# Patient Record
Sex: Female | Born: 1950 | Race: White | Hispanic: No | Marital: Single | Smoking: Current every day smoker
Health system: Southern US, Community
[De-identification: ages and names within clinical notes are randomized; demographics above are authoritative.]

## PROBLEM LIST (undated history)

## (undated) DIAGNOSIS — D649 Anemia, unspecified: Secondary | ICD-10-CM

## (undated) DIAGNOSIS — R609 Edema, unspecified: Secondary | ICD-10-CM

## (undated) DIAGNOSIS — K52831 Collagenous colitis: Secondary | ICD-10-CM

## (undated) DIAGNOSIS — F32A Depression, unspecified: Secondary | ICD-10-CM

## (undated) DIAGNOSIS — F329 Major depressive disorder, single episode, unspecified: Secondary | ICD-10-CM

## (undated) DIAGNOSIS — M1990 Unspecified osteoarthritis, unspecified site: Secondary | ICD-10-CM

## (undated) HISTORY — DX: Major depressive disorder, single episode, unspecified: F32.9

## (undated) HISTORY — DX: Depression, unspecified: F32.A

## (undated) HISTORY — DX: Collagenous colitis: K52.831

## (undated) HISTORY — DX: Edema, unspecified: R60.9

## (undated) HISTORY — PX: GASTRIC BYPASS: SHX52

---

## 2015-01-29 HISTORY — PX: CARDIAC CATHETERIZATION: SHX172

## 2015-08-10 ENCOUNTER — Encounter: Payer: Self-pay | Admitting: Gastroenterology

## 2015-08-16 ENCOUNTER — Ambulatory Visit: Payer: Self-pay | Admitting: Gastroenterology

## 2015-08-16 ENCOUNTER — Telehealth: Payer: Self-pay | Admitting: *Deleted

## 2015-08-16 NOTE — Telephone Encounter (Signed)
Called patient to inform her she missed her appointment today, she stated she is in the hospital with non GI related issues and will contact the office when she gets home to reschedule her appointment

## 2015-08-24 ENCOUNTER — Encounter: Payer: Self-pay | Admitting: Gastroenterology

## 2015-10-24 ENCOUNTER — Telehealth: Payer: Self-pay | Admitting: *Deleted

## 2015-10-24 NOTE — Telephone Encounter (Signed)
Called and left message for patient that Dr Lavon PaganiniNandigam approved her to come in on 9/28 at 10:45 if she wants that appointment to give us a call back and let us know.  Otherwise her appointment is scheduled on 10/11

## 2015-10-26 NOTE — Telephone Encounter (Signed)
Patient never returned call for an appointment today

## 2015-11-08 ENCOUNTER — Encounter (INDEPENDENT_AMBULATORY_CARE_PROVIDER_SITE_OTHER): Payer: Self-pay

## 2015-11-08 ENCOUNTER — Other Ambulatory Visit (INDEPENDENT_AMBULATORY_CARE_PROVIDER_SITE_OTHER): Payer: PPO

## 2015-11-08 ENCOUNTER — Encounter: Payer: Self-pay | Admitting: Gastroenterology

## 2015-11-08 ENCOUNTER — Ambulatory Visit (INDEPENDENT_AMBULATORY_CARE_PROVIDER_SITE_OTHER): Payer: PPO | Admitting: Gastroenterology

## 2015-11-08 VITALS — BP 94/60 | HR 84 | Ht 62.21 in | Wt 132.2 lb

## 2015-11-08 DIAGNOSIS — K52831 Collagenous colitis: Secondary | ICD-10-CM

## 2015-11-08 DIAGNOSIS — R634 Abnormal weight loss: Secondary | ICD-10-CM | POA: Diagnosis not present

## 2015-11-08 DIAGNOSIS — R197 Diarrhea, unspecified: Secondary | ICD-10-CM | POA: Diagnosis not present

## 2015-11-08 DIAGNOSIS — K909 Intestinal malabsorption, unspecified: Secondary | ICD-10-CM

## 2015-11-08 LAB — CBC WITH DIFFERENTIAL/PLATELET
Basophils Absolute: 0.1 10*3/uL (ref 0.0–0.1)
Basophils Relative: 0.4 % (ref 0.0–3.0)
Eosinophils Absolute: 0.2 10*3/uL (ref 0.0–0.7)
Eosinophils Relative: 1.7 % (ref 0.0–5.0)
HCT: 31.4 % — ABNORMAL LOW (ref 36.0–46.0)
Hemoglobin: 9.8 g/dL — ABNORMAL LOW (ref 12.0–15.0)
Lymphocytes Relative: 28.2 % (ref 12.0–46.0)
Lymphs Abs: 3.4 10*3/uL (ref 0.7–4.0)
MCHC: 31.1 g/dL (ref 30.0–36.0)
MCV: 70.1 fl — ABNORMAL LOW (ref 78.0–100.0)
Monocytes Absolute: 0.7 10*3/uL (ref 0.1–1.0)
Monocytes Relative: 5.6 % (ref 3.0–12.0)
Neutro Abs: 7.8 10*3/uL — ABNORMAL HIGH (ref 1.4–7.7)
Neutrophils Relative %: 64.1 % (ref 43.0–77.0)
Platelets: 497 10*3/uL — ABNORMAL HIGH (ref 150.0–400.0)
RBC: 4.47 Mil/uL (ref 3.87–5.11)
RDW: 19.3 % — ABNORMAL HIGH (ref 11.5–15.5)
WBC: 12.2 10*3/uL — ABNORMAL HIGH (ref 4.0–10.5)

## 2015-11-08 LAB — COMPREHENSIVE METABOLIC PANEL WITH GFR
ALT: 14 U/L (ref 0–35)
AST: 20 U/L (ref 0–37)
Albumin: 2.5 g/dL — ABNORMAL LOW (ref 3.5–5.2)
Alkaline Phosphatase: 29 U/L — ABNORMAL LOW (ref 39–117)
BUN: 22 mg/dL (ref 6–23)
CO2: 30 meq/L (ref 19–32)
Calcium: 8.2 mg/dL — ABNORMAL LOW (ref 8.4–10.5)
Chloride: 104 meq/L (ref 96–112)
Creatinine, Ser: 0.71 mg/dL (ref 0.40–1.20)
GFR: 87.8 mL/min
Glucose, Bld: 103 mg/dL — ABNORMAL HIGH (ref 70–99)
Potassium: 3.5 meq/L (ref 3.5–5.1)
Sodium: 139 meq/L (ref 135–145)
Total Bilirubin: 0.2 mg/dL (ref 0.2–1.2)
Total Protein: 4.7 g/dL — ABNORMAL LOW (ref 6.0–8.3)

## 2015-11-08 MED ORDER — BUDESONIDE 9 MG PO TB24: ORAL_TABLET | ORAL | 0 refills | Status: DC

## 2015-11-08 MED ORDER — CHOLESTYRAMINE 4 G PO PACK: 4.0000 g | PACK | Freq: Two times a day (BID) | ORAL | 6 refills | Status: DC

## 2015-11-08 NOTE — Progress Notes (Signed)
Renee [name]    [id number]    03/24/1950  Primary Care Physician:Pcp Not In System  Referring Physician: No referring provider defined for this encounter.  Chief complaint:  Diarrhea  HPI: 65 year old female here for second opinion with complaints of diarrhea for past 7 months. He is having 10-12 watery liquid bowel movements a day. She has lost 40 pounds in the past 6 months. Patient underwent EGD and colonoscopy on 06/08/2015 for evaluation of chronic diarrhea and iron deficiency anemia, and colon random biopsies showed findings suggestive of collagenous colitis. She is status post gastric bypass surgery. She feels chronic fatigue, and nocturnal cramps in the legs. He reports taking about 2 or 3 ibuprofen a day for pain in both wrists and arms. Denies any nausea, vomiting, abdominal pain, melena or bright red blood per rectum  Outpatient Encounter Prescriptions as of 11/08/2015  Medication Sig  . buPROPion (WELLBUTRIN XL) 150 MG 24 hr tablet Take 150 mg by mouth daily.  . hydrochlorothiazide (HYDRODIURIL) 25 MG tablet Take 25 mg by mouth daily.  Marland Kitchen. POTASSIUM PO Take 1 tablet by mouth 2 (two) times daily.  . traZODone (DESYREL) 50 MG tablet TAKE 2 TABLETS BY MOUTH EVERY NIGHT AT BEDTIME AS NEEDED FOR SLEEP  . Budesonide 9 MG TB24 Samples given in office  18 day supply  Exp 11/19     Lot  [id number]  . Calcium Carb-Ergocalciferol 500-200 MG-UNIT TABS Take 1 tablet by mouth daily.  . cholestyramine (QUESTRAN) 4 g packet Take 1 packet (4 g total) by mouth 2 (two) times daily.  . citalopram (CELEXA) 40 MG tablet Take 40 mg by mouth daily.   . Multiple Vitamin (MULTI-VITAMINS) TABS Take 1 tablet by mouth daily.   No facility-administered encounter medications on file as of 11/08/2015.     Allergies as of 11/08/2015 - never reviewed  Allergen Reaction Noted  . 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 Depression   . Edema     Past Surgical  History:  Procedure Laterality Date  . CESAREAN SECTION    . GASTRIC BYPASS      Family History  Problem Relation Age of Onset  . Diabetes Mother   . Heart disease Mother   . Heart disease Father   . Breast cancer Maternal Grandmother   . Colon cancer Paternal Grandfather     Social History   Social History  . Marital status: Married    Spouse name: N/A  . Number of children: 1  . Years of education: N/A   Occupational History  . LPN     retired  . [employer]    Social History Main Topics  . Smoking status: Current Some Day Smoker  . Smokeless tobacco: Never Used     Comment: social  . Alcohol use No  . Drug use: No  . Sexual activity: Not on file   Other Topics Concern  . Not on file   Social History Narrative  . No narrative on file      Review of systems: Review of Systems  Constitutional: Negative for fever and chills.  HENT: Negative.   Eyes: Negative for blurred vision.  Respiratory: Negative for cough, shortness of breath and wheezing.   Cardiovascular: Negative for chest pain and palpitations.  Gastrointestinal: as per HPI Genitourinary: Negative for dysuria, urgency, frequency and hematuria.  Musculoskeletal: Positive for myalgias, back pain and joint pain.  Skin: Negative for itching and rash.  Neurological: Negative for dizziness, tremors, focal weakness, seizures and loss of consciousness.  Endo/Heme/Allergies: Positive for seasonal allergies.  Psychiatric/Behavioral: Positive for depression, negative for suicidal ideas and hallucinations.  All other systems reviewed and are negative.   Physical Exam: Vitals:   11/08/15 1326  BP: 94/60  Pulse: 84   Body mass index is 24.03 kg/m. Gen:      No acute distress HEENT:  EOMI, sclera anicteric Neck:     No masses; no thyromegaly Lungs:    Clear to auscultation bilaterally; normal respiratory effort CV:         Regular rate and rhythm; no murmurs Abd:      + bowel sounds; soft,  non-tender; no palpable masses, no distension Ext:    No edema; adequate peripheral perfusion Skin:      Warm and dry; no rash Neuro: alert and oriented x 3 Psych: normal mood and affect  Data Reviewed:  Reviewed labs, radiology imaging, old records and pertinent past GI work up C. difficile and she has to pathogen panel negative Celiac negative Random colon biopsies positive for collagenous colitis  Assessment and Plan/Recommendations: 65 year old female status post gastric bypass with complaints of chronic diarrhea for past 7 months and also has iron deficiency anemia Random colon biopsies positive for finding of collagenous colitis We will start budesonide 9 mg daily, patient was given samples for 3 weeks We will send prescription to compound pharmacy for budesonide 10 mg after she completes the samples for 10 days and then she'll take 5 mg budesonide for additional 4 weeks taper She also appears malnourished, likely has low albumin level  Encouraged patient to increase protein intake and nutritional drinks Avoid lactose and high fructose Avoid nsaids We'll check CMP and CBC  Cholestyramine 4 g twice a day with meals Return in 4-6 weeks  Greater than 50% of the time used for counseling as well as treatment plan and follow-up. She had multiple questions which were answered to her satisfaction  K. Scherry Ran , MD [phone] Mon-Fri 8a-5p [phone] after 5p, weekends, holidays  CC: No ref. provider found

## 2015-11-08 NOTE — Patient Instructions (Addendum)
We have given you Uceris samples to take 9 mg daily for 18 days- until samples run out  The Budesonide 10 mg will be sent to [facility] [place] [phone], Per your request we will call this in for you once you complete your samples given in the office  You will take Budesonide 10 mg for 10 days the take 5 mg (Half the tablet) for 30 days thereafter  Then Stop  We will send Cholestyramine 4 g twice a day to your pharmacy  Avoid NSAIDS  Avoid artificial sweeteners and high fructose corn syrup   Go to the basement for labs today  Lactose free diet   Lactose-Free Diet, Adult If you have lactose intolerance, you are not able to digest lactose. Lactose is a natural sugar found mainly in milk and milk products. You may need to avoid all foods and beverages that contain lactose. A lactose-free diet can help you do this.  WHAT DO I NEED TO KNOW ABOUT THIS DIET?  Do not consume foods, beverages, vitamins, minerals, or medicines with lactose. Read ingredients lists carefully.  Look for the words "lactose-free" on labels.  Use lactase enzyme drops or tablets as directed by your health care provider.  Use lactose-free milk or a milk alternative, such as soy milk, for drinking and cooking.  Make sure you get enough calcium and vitamin D in your diet. A lactose-free eating plan can be lacking in these important nutrients.  Take calcium and vitamin D supplements as directed by your health care provider. Talk to your provider about supplements if you are not able to get enough calcium and vitamin D from food. WHICH FOODS HAVE LACTOSE? Lactose is found in:   Milk and foods made from milk.  Yogurt.   Cheese.  Butter.   Margarine.   Sour cream.   Cream.   Whipped toppings and nondairy creamers.  Ice cream and other milk-based desserts. Lactose is also found in foods or products made with milk or milk ingredients. To find out whether a food contains milk or a  milk ingredient, look at the ingredients list. Avoid foods with the statement "May contain milk" and foods that contain:   Butter.   Cream.  Milk.  Milk solids.  Milk powder.   Whey.  Curd.  Caseinate.  Lactose.  Lactalbumin.  Lactoglobulin. WHAT ARE SOME ALTERNATIVES TO MILK AND FOODS MADE WITH MILK PRODUCTS?  Lactose-free milk.  Soy milk with added calcium and vitamin D.  Almond, coconut, or rice milk with added calcium and vitamin D. Note that these are low in protein.   Soy products, such as soy yogurt, soy cheese, soy ice cream, and soy-based sour cream. WHICH FOODS CAN I EAT? Grains Breads and rolls made without milk, such as [place], [place], or [place] bread, bagels, pita, and Pitney Bowesmatzo. Corn tortillas, corn meal, grits, and polenta. Crackers without lactose or milk solids, such as soda crackers and graham crackers. Cooked or dry cereals without lactose or milk solids. Pasta, quinoa, couscous, barley, oats, bulgur, farro, rice, wild rice, or other grains prepared without milk or lactose. Plain popcorn.  Vegetables Fresh, frozen, and canned vegetables without cheese, cream, or butter sauces. Fruits All fresh, canned, frozen, or dried fruits that are not processed with lactose. Meats and Other Protein Sources Plain beef, chicken, fish, [place], lamb, veal, pork, wild game, or ham. Kosher-prepared meat products. Strained or junior meats that do not contain milk. Eggs. Soy meat substitutes. Beans, lentils, and hummus. Tofu.  Nuts and seeds. Peanut or other nut butters without lactose. Soups, casseroles, and mixed dishes without cheese, cream, or milk.  Dairy Lactose-free milk. Soy, rice, or almond milk with added calcium and vitamin D. Soy cheese and yogurt. Beverages Carbonated drinks. Tea. Coffee, freeze-dried coffee, and some instant coffees. Fruit and vegetable juices.  Condiments Soy sauce. Carob powder. Olives. Gravy made with water. Baker's cocoa. Rosita Fire.  Pure seasonings and spices. Ketchup. Mustard. Bouillon. Broth.  Sweets and Desserts Water and fruit ices. Gelatin. Cookies, pies, or cakes made from allowed ingredients, such as angel food cake. Pudding made with water or a milk substitute. Lactose-free tofu desserts. Soy, coconut milk, or rice-milk-based frozen desserts. Sugar. Honey. Jam, jelly, and marmalade. Molasses. Pure sugar candy. Dark chocolate without milk. Marshmallows.  Fats and Oils Margarines and salad dressings that do not contain milk. Tomasa Blase. Vegetable oils. Shortening. Mayonnaise. Soy or coconut-based cream.  The items listed above may not be a complete list of recommended foods or beverages. Contact your dietitian for more options.  WHICH FOODS ARE NOT RECOMMENDED? Grains Breads and rolls that contain milk. Toaster pastries. Muffins, biscuits, waffles, cornbread, and pancakes. These can be prepared at home, commercial, or from mixes. Sweet rolls, donuts, English muffins, fry bread, lefse, flour tortillas with lactose, or [place] toast made with milk or milk ingredients. Crackers that contain lactose. Corn curls. Cooked or dry cereals with lactose. Vegetables Creamed or breaded vegetables. Vegetables in a cheese or butter sauce or with lactose-containing margarines. Instant potatoes. [place] fries. Scalloped or au gratin potatoes.  Fruits None.  Meats and Other Protein Sources Scrambled eggs, omelets, and souffles that contain milk. Creamed or breaded meat, fish, chicken, or [place]. Sausage products, such as wieners and liver sausage. Cold cuts that contain milk solids. Cheese, cottage cheese, ricotta cheese, and cheese spreads. Lasagna and macaroni and cheese. Pizza. Peanut or other nut butters with added milk solids. Casseroles or mixed dishes containing milk or cheese.  Dairy All dairy products, including milk, goat's milk, buttermilk, kefir, acidophilus milk, flavored milk, evaporated milk, condensed milk, dulce de [place], eggnog,  yogurt, cheese, and cheese spreads.  Beverages Hot chocolate. Cocoa with lactose. Instant iced teas. Powdered fruit drinks. Smoothies made with milk or yogurt.  Condiments Chewing gum that has lactose. Cocoa that has lactose. Spice blends if they contain milk products. Artificial sweeteners that contain lactose. Nondairy creamers.  Sweets and Desserts Ice cream, ice milk, gelato, sherbet, and frozen yogurt. Custard, pudding, and mousse. Cake, cream pies, cookies, and other desserts containing milk, cream, cream cheese, or milk chocolate. Pie crust made with milk-containing margarine or butter. Reduced-calorie desserts made with a sugar substitute that contains lactose. Toffee and butterscotch. Milk, white, or dark chocolate that contains milk. Fudge. Caramel.  Fats and Oils Margarines and salad dressings that contain milk or cheese. Cream. Half and half. Cream cheese. Sour cream. Chip dips made with sour cream or yogurt.  The items listed above may not be a complete list of foods and beverages to avoid. Contact your dietitian for more information. AM I GETTING ENOUGH CALCIUM? Calcium is found in many foods that contain lactose and is important for bone health. The amount of calcium you need depends on your age:   Adults younger than 50 years: 1000 mg of calcium a day.  Adults older than 50 years: 1200 mg of calcium a day. If you are not getting enough calcium, other calcium sources include:   Orange juice with calcium added. There are 300-350 mg  of calcium in 1 cup of orange juice.   Sardines with edible bones. There are 325 mg of calcium in 3 oz of sardines.   Calcium-fortified soy milk. There are 300-400 mg of calcium in 1 cup of calcium-fortified soy milk.  Calcium-fortified rice or almond milk. There are 300 mg of calcium in 1 cup of calcium-fortified rice or almond milk.  Canned salmon with edible bones. There are 180 mg of calcium in 3 oz of canned salmon with edible bones.    Calcium-fortified breakfast cereals. There are [phone] mg of calcium in calcium-fortified breakfast cereals.   Tofu set with calcium sulfate. There are 250 mg of calcium in  cup of tofu set with calcium sulfate.  Spinach, cooked. There are 145 mg of calcium in  cup of cooked spinach.  Edamame, cooked. There are 130 mg of calcium in  cup of cooked edamame.  Collard greens, cooked. There are 125 mg of calcium in  cup of cooked collard greens.  Kale, frozen or cooked. There are 90 mg of calcium in  cup of cooked or frozen kale.   Almonds. There are 95 mg of calcium in  cup of almonds.  Broccoli, cooked. There are 60 mg of calcium in 1 cup of cooked broccoli.   This information is not intended to replace advice given to you by your health care provider. Make sure you discuss any questions you have with your health care provider.   Document Released: 07/06/2001 Document Revised: 05/31/2014 Document Reviewed: 04/16/2013 Elsevier Interactive Patient Education [employer]

## 2015-11-16 ENCOUNTER — Telehealth: Payer: Self-pay

## 2015-11-16 NOTE — Telephone Encounter (Signed)
Pt called stating that med for diarrhea is helping that Dr Lavon PaganiniNandigam Rx. Also wanted to inform of bilateral leg edema. She denies SOB or chest pain. Instructed that she should contact PCP for this.

## 2015-11-17 NOTE — Telephone Encounter (Signed)
Dr Nandigam FYI 

## 2015-11-20 NOTE — Telephone Encounter (Signed)
Called patient to inform We have three boxes of samples here for her of Uceris. Patient wanted me to contact [facility]  And call her Budesonide in . Called pharmacy and called in budesonide for patient    # 60 with 1 refill to cover her reducing to 5 mg daily for two months after completing 10 mg for 1 month  Patient will pick up tomorrow

## 2015-11-20 NOTE — Telephone Encounter (Signed)
Call back patient, diarrhea has improved significantly, is having 1-2 formed BM per day. Continues to have lower extremity edema, likely secondary to severe hypoalbuminemia Advised patient to eat protein rich diet and follow up with PMD to see if she needs to be started on Low dose Lasix instead of HCTZ and also to see if needs further cardiac work up to exclude CHF She has only 5 more days of Uceris 9mg  samples Will send Rx to compound pharmacy Budesonide 10mg  daily X 1 month followed by 5mg  daily x 2 months Follow up in office in 4-6 weeks

## 2016-01-04 ENCOUNTER — Ambulatory Visit: Payer: PPO | Admitting: Gastroenterology

## 2016-01-23 ENCOUNTER — Telehealth: Payer: Self-pay | Admitting: *Deleted

## 2016-01-23 NOTE — Telephone Encounter (Signed)
Left message for pt to call the office and schedule a follow up with Dr Lavon PaganiniNandigam

## 2016-03-29 ENCOUNTER — Telehealth: Payer: Self-pay | Admitting: Gastroenterology

## 2016-03-29 MED ORDER — BUDESONIDE 9 MG PO TB24: ORAL_TABLET | ORAL | 1 refills | Status: DC

## 2016-03-29 NOTE — Telephone Encounter (Signed)
Called patient and she wanted Budesonide called into her pharmacy. She had an appointment to see Jess on 3/13 but wanted to see Dr Lavon PaganiniNandigam next week if she could be fit in for Colitis. Said she thought she was having a flare. I offered to take a triage call and send to the nurse but she said she just wanted to see Dr Lavon PaganiniNandigam on Tuesday that she would be fine taking the budesonide over the weekend.   Dr Lavon PaganiniNandigam I put this patient on for Tuesday at 10:45am

## 2016-04-01 NOTE — Telephone Encounter (Signed)
Ok thanks 

## 2016-04-02 ENCOUNTER — Other Ambulatory Visit (INDEPENDENT_AMBULATORY_CARE_PROVIDER_SITE_OTHER): Payer: PPO

## 2016-04-02 ENCOUNTER — Encounter (INDEPENDENT_AMBULATORY_CARE_PROVIDER_SITE_OTHER): Payer: Self-pay

## 2016-04-02 ENCOUNTER — Encounter: Payer: Self-pay | Admitting: Gastroenterology

## 2016-04-02 ENCOUNTER — Ambulatory Visit (INDEPENDENT_AMBULATORY_CARE_PROVIDER_SITE_OTHER): Payer: PPO | Admitting: Gastroenterology

## 2016-04-02 VITALS — BP 116/70 | HR 80 | Ht 62.0 in | Wt 131.2 lb

## 2016-04-02 DIAGNOSIS — Z9884 Bariatric surgery status: Secondary | ICD-10-CM | POA: Diagnosis not present

## 2016-04-02 DIAGNOSIS — R197 Diarrhea, unspecified: Secondary | ICD-10-CM | POA: Diagnosis not present

## 2016-04-02 DIAGNOSIS — K52838 Other microscopic colitis: Secondary | ICD-10-CM

## 2016-04-02 DIAGNOSIS — K52831 Collagenous colitis: Secondary | ICD-10-CM | POA: Diagnosis not present

## 2016-04-02 LAB — COMPREHENSIVE METABOLIC PANEL
ALBUMIN: 3.5 g/dL (ref 3.5–5.2)
ALT: 16 U/L (ref 0–35)
AST: 19 U/L (ref 0–37)
Alkaline Phosphatase: 42 U/L (ref 39–117)
BUN: 21 mg/dL (ref 6–23)
CALCIUM: 9 mg/dL (ref 8.4–10.5)
CHLORIDE: 104 meq/L (ref 96–112)
CO2: 29 meq/L (ref 19–32)
CREATININE: 0.54 mg/dL (ref 0.40–1.20)
GFR: 120.26 mL/min (ref >60.00)
Glucose, Bld: 140 mg/dL — ABNORMAL HIGH (ref 70–99)
Potassium: 3.8 mEq/L (ref 3.5–5.1)
Sodium: 138 mEq/L (ref 135–145)
Total Bilirubin: 0.3 mg/dL (ref 0.2–1.2)
Total Protein: 6 g/dL (ref 6.0–8.3)

## 2016-04-02 LAB — FERRITIN: FERRITIN: 3.3 ng/mL — AB (ref 10.0–291.0)

## 2016-04-02 LAB — VITAMIN B12: VITAMIN B 12: 281 pg/mL (ref 211–911)

## 2016-04-02 LAB — FOLATE

## 2016-04-02 NOTE — Patient Instructions (Addendum)
Take Budesonide 9mg  daily for 3 months  Use VSL#3 1 capsule daily  Start pepto-bismol in 1 month 1 tablet four times daily   Your follow up appointment is on 05/23/2016 at 10am   Go to the basement for labs today

## 2016-04-02 NOTE — Progress Notes (Signed)
Renee [name]    [id number]    Nov 11, 1950  Primary Care Physician:Pcp Not In System  Referring Physician: No referring provider defined for this encounter.  Chief complaint:  Colitis, diarrhea  HPI: 66 year old female with history of gastric bypass surgery, diagnosed with collagenous colitis in May 2017. She was last seen in office in October 2017 when she switched her care to our practice from [facility] She was having chronic diarrhea with multiple bowel movements for about 7 months before she came to see me in October and had lost 40 pounds. She was started on budesonide 9 mg daily, which she took for one month and tapered down to 5 mg daily compounded budesonide for 1 additional month. Patient missed her follow-up appointment in December. She was doing well for about 4-6 weeks after she tapered off budesonide , last week she noticed that she was having increased bowel frequency and diarrhea , called office for refill of budesonide . Her bowel movements have improved to 1-2 soft movements daily since she started taking it for the past 2 days.  She has started taking NSAID's , Advil 1-2 tablets daily . Denies any other new medications   No fever, nausea, vomiting, abdominal pain, melena or blood per rectum  Outpatient Encounter Prescriptions as of 04/02/2016  Medication Sig  . budesonide (ENTOCORT EC) 3 MG 24 hr capsule Take 9 mg by mouth daily.  Marland Kitchen buPROPion (WELLBUTRIN XL) 150 MG 24 hr tablet Take 150 mg by mouth daily.  . Calcium Carb-Ergocalciferol 500-200 MG-UNIT TABS Take 1 tablet by mouth daily.  . citalopram (CELEXA) 40 MG tablet Take 40 mg by mouth daily.   . Multiple Vitamin (MULTI-VITAMINS) TABS Take 1 tablet by mouth daily.  Marland Kitchen POTASSIUM PO Take 1 tablet by mouth 2 (two) times daily.  . traZODone (DESYREL) 50 MG tablet TAKE 2 TABLETS BY MOUTH EVERY NIGHT AT BEDTIME AS NEEDED FOR SLEEP  . [DISCONTINUED] Budesonide 9 MG TB24 Once a day  . [DISCONTINUED] cholestyramine  (QUESTRAN) 4 g packet Take 1 packet (4 g total) by mouth 2 (two) times daily.  . [DISCONTINUED] hydrochlorothiazide (HYDRODIURIL) 25 MG tablet Take 25 mg by mouth daily.   No facility-administered encounter medications on file as of 04/02/2016.     Allergies as of 04/02/2016 - Review Complete 04/02/2016  Allergen Reaction Noted  . 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 Collagenous colitis   . Depression   . Edema     Past Surgical History:  Procedure Laterality Date  . CESAREAN SECTION    . GASTRIC BYPASS      Family History  Problem Relation Age of Onset  . Diabetes Mother   . Heart disease Mother   . Heart disease Father   . Breast cancer Maternal Grandmother   . Colon cancer Paternal Grandfather     Social History   Social History  . Marital status: Married    Spouse name: N/A  . Number of children: 1  . Years of education: N/A   Occupational History  . LPN     retired  . [employer]    Social History Main Topics  . Smoking status: Current Some Day Smoker  . Smokeless tobacco: Never Used     Comment: social  . Alcohol use No  . Drug use: No  . Sexual activity: Not on file   Other Topics Concern  . Not  on file   Social History Narrative  . No narrative on file      Review of systems: Review of Systems  Constitutional: Negative for fever and chills.  positive for lack of energy HENT: Negative.   Eyes: Negative for blurred vision.  Respiratory: Negative for cough, shortness of breath and wheezing.   Cardiovascular: Negative for chest pain and palpitations.  Gastrointestinal: as per HPI Genitourinary: Negative for dysuria, urgency, frequency and hematuria.  Musculoskeletal: Negative for myalgias, back pain and joint pain.  Skin: Negative for itching and rash.  Neurological: Negative for dizziness, tremors, focal weakness, seizures and loss of consciousness.  Endo/Heme/Allergies: Negative for seasonal  allergies.  Psychiatric/Behavioral: Negative for depression, suicidal ideas and hallucinations.  All other systems reviewed and are negative.   Physical Exam: Vitals:   04/02/16 1126  BP: 116/70  Pulse: 80   Body mass index is 24 kg/m. Gen:      No acute distress HEENT:  EOMI, sclera anicteric Neck:     No masses; no thyromegaly Lungs:    Clear to auscultation bilaterally; normal respiratory effort CV:         Regular rate and rhythm; no murmurs Abd:      + bowel sounds; soft, non-tender; no palpable masses, no distension Ext:    No edema; adequate peripheral perfusion Skin:      Warm and dry; no rash Neuro: alert and oriented x 3 Psych: normal mood and affect  Data Reviewed:  Reviewed labs, radiology imaging, old records and pertinent past GI work up   Assessment and Plan/Recommendations: 66 year old female status post gastric bypass surgery and collagenous colitis was in remission after a short taper of budesonide , recurrent symptoms    diarrhea has improved after restarting budesonide 9 mg daily, plan for slightly longer course for 12 weeks and then taper over the next 4 weeks ( 6 mg daily for 2 weeks and then 3 mg daily for an additional 2 weeks)  Advised patient to avoid nsaids as that could have potentially trigger flare of collagenous colitis  We will also start Pepto-Bismol one tablet 4 times daily and we'll slowly increase to 3 tablets 3-4 times daily as tolerated  Discussed high protein diet  Probiotic VSL#3 x 2-4 weeks  Check CMP, ferritin, B12 and folate.  Return in 2 months   25 minutes was spent face-to-face with the patient. Greater than 50% of the time used for counseling as well as treatment plan and follow-up. She had multiple questions which were answered to her satisfaction  K. Scherry RanVeena Keiron Iodice , MD [phone] Mon-Fri 8a-5p [phone] after 5p, weekends, holidays  CC: No ref. provider found

## 2016-04-03 ENCOUNTER — Other Ambulatory Visit: Payer: Self-pay

## 2016-04-03 DIAGNOSIS — D539 Nutritional anemia, unspecified: Secondary | ICD-10-CM

## 2016-04-03 MED ORDER — FERROUS SULFATE 324 (65 FE) MG PO TBEC: 1.0000 | DELAYED_RELEASE_TABLET | Freq: Three times a day (TID) | ORAL | 11 refills | Status: DC

## 2016-04-03 MED ORDER — "SYRINGE/NEEDLE (DISP) 27G X 5/8"" 1 ML MISC": 11 refills | Status: DC

## 2016-04-03 MED ORDER — CYANOCOBALAMIN 1000 MCG/ML IJ SOLN: 1000.0000 ug | Freq: Once | INTRAMUSCULAR | 11 refills | Status: AC

## 2016-04-09 ENCOUNTER — Ambulatory Visit: Payer: PPO | Admitting: Gastroenterology

## 2016-04-30 DIAGNOSIS — R769 Abnormal immunological finding in serum, unspecified: Secondary | ICD-10-CM | POA: Diagnosis not present

## 2016-04-30 DIAGNOSIS — F325 Major depressive disorder, single episode, in full remission: Secondary | ICD-10-CM | POA: Diagnosis not present

## 2016-05-23 ENCOUNTER — Encounter (INDEPENDENT_AMBULATORY_CARE_PROVIDER_SITE_OTHER): Payer: Self-pay

## 2016-05-23 ENCOUNTER — Encounter: Payer: Self-pay | Admitting: Gastroenterology

## 2016-05-23 ENCOUNTER — Ambulatory Visit (INDEPENDENT_AMBULATORY_CARE_PROVIDER_SITE_OTHER): Payer: PPO | Admitting: Gastroenterology

## 2016-05-23 VITALS — BP 100/70 | HR 72 | Ht 62.21 in | Wt 133.2 lb

## 2016-05-23 DIAGNOSIS — K52831 Collagenous colitis: Secondary | ICD-10-CM

## 2016-05-23 MED ORDER — CHOLESTYRAMINE 4 G PO PACK: 4.0000 g | PACK | Freq: Every day | ORAL | 6 refills | Status: DC

## 2016-05-23 MED ORDER — BUDESONIDE 9 MG PO TB24: 9.0000 mg | ORAL_TABLET | Freq: Every day | ORAL | 0 refills | Status: DC

## 2016-05-23 MED ORDER — BISMUTH SUBSALICYLATE 262 MG PO TABS: ORAL_TABLET | ORAL | 6 refills | Status: DC

## 2016-05-23 NOTE — Patient Instructions (Signed)
Your Prescriptions have been sent to your pharmacy

## 2016-05-24 DIAGNOSIS — K52831 Collagenous colitis: Secondary | ICD-10-CM | POA: Insufficient documentation

## 2016-05-24 NOTE — Progress Notes (Signed)
Renee [name]    [id number]    [birth year]  Primary Care Physician:FULBRIGHT, Deloria Lair, PA-C  Referring Physician: No referring provider defined for this encounter.  Chief complaint:  Collagenous colitis  HPI:  66 year old female with collagenous colitis diagnosed in May 2007 that was untreated until she switched her care from [facility] and established with our practice in October 2017. She was treated with budesonide 9 mg daily for a month followed by compounded budesonide 5 mg daily for one month. She had recurrence of symptoms in end of February with diarrhea and was restarted on budesonide 9 mg daily. She was taking ibuprofen 1-2 tablets daily but has quit since March after her last office visit on 04/02/2016 as per my recommendation. She is having one to 2 soft bowel movements daily. She has gained back weight. She is feeling well overall. Denies any nausea, vomiting, abdominal pain, melena or bright red blood per rectum    Outpatient Encounter Prescriptions as of 05/23/2016  Medication Sig  . budesonide (ENTOCORT EC) 3 MG 24 hr capsule Take 9 mg by mouth daily.  Marland Kitchen buPROPion (WELLBUTRIN XL) 150 MG 24 hr tablet Take 150 mg by mouth daily.  . Calcium Carb-Ergocalciferol 500-200 MG-UNIT TABS Take 1 tablet by mouth daily.  . citalopram (CELEXA) 40 MG tablet Take 40 mg by mouth daily.   . ferrous sulfate 324 (65 Fe) MG TBEC Take 1 tablet (325 mg total) by mouth 3 (three) times daily with meals.  . Multiple Vitamin (MULTI-VITAMINS) TABS Take 1 tablet by mouth daily.  Marland Kitchen POTASSIUM PO Take 1 tablet by mouth 2 (two) times daily.  . Syringe/Needle, Disp, 27G X 5/8" 1 ML MISC Use once monthly for subcutaneous  B12 injections  . traZODone (DESYREL) 50 MG tablet TAKE 2 TABLETS BY MOUTH EVERY NIGHT AT BEDTIME AS NEEDED FOR SLEEP  . Bismuth Subsalicylate 262 MG TABS Take 3 tablets three times a day  . Budesonide 9 MG TB24 Take 9 mg by mouth daily.  . cholestyramine (QUESTRAN) 4 g  packet Take 1 packet (4 g total) by mouth daily.   No facility-administered encounter medications on file as of 05/23/2016.     Allergies as of 05/23/2016 - Review Complete 05/23/2016  Allergen Reaction Noted  . 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 Collagenous colitis   . Depression   . Edema     Past Surgical History:  Procedure Laterality Date  . CESAREAN SECTION    . GASTRIC BYPASS      Family History  Problem Relation Age of Onset  . Diabetes Mother   . Heart disease Mother   . Heart disease Father   . Breast cancer Maternal Grandmother   . Colon cancer Paternal Grandfather     Social History   Social History  . Marital status: Married    Spouse name: N/A  . Number of children: 1  . Years of education: N/A   Occupational History  . LPN     retired  . [employer]    Social History Main Topics  . Smoking status: Current Some Day Smoker  . Smokeless tobacco: Never Used     Comment: social  . Alcohol use No  . Drug use: No  . Sexual activity: Not on file   Other Topics Concern  . Not on file   Social History Narrative  . No narrative on file  Review of systems: Review of Systems  Constitutional: Negative for fever and chills.  HENT: Negative.   Eyes: Negative for blurred vision.  Respiratory: Negative for cough, shortness of breath and wheezing.   Cardiovascular: Negative for chest pain and palpitations.  Gastrointestinal: as per HPI Genitourinary: Negative for dysuria, urgency, frequency and hematuria.  Musculoskeletal: Negative for myalgias, back pain and joint pain.  Skin: Negative for itching and rash.  Neurological: Negative for dizziness, tremors, focal weakness, seizures and loss of consciousness.  Endo/Heme/Allergies: Positive for seasonal allergies.  Psychiatric/Behavioral: Negative for depression, suicidal ideas and hallucinations.  All other systems reviewed and are negative.   Physical  Exam: Vitals:   05/23/16 1009  BP: 100/70  Pulse: 72   Body mass index is 24.21 kg/m. Gen:      No acute distress HEENT:  EOMI, sclera anicteric Neck:     No masses; no thyromegaly Lungs:    Clear to auscultation bilaterally; normal respiratory effort CV:         Regular rate and rhythm; no murmurs Abd:      + bowel sounds; soft, non-tender; no palpable masses, no distension Ext:    No edema; adequate peripheral perfusion Skin:      Warm and dry; no rash Neuro: alert and oriented x 3 Psych: normal mood and affect  Data Reviewed:  Reviewed labs, radiology imaging, old records and pertinent past GI work up   Assessment and Plan/Recommendations:  66 year old female with history of gastric bypass surgery and collagenous colitis here for follow-up visit  She had recurrence of symptoms after a four-week course of budesonide 9 mg followed by taper for additional 4 weeks  We'll plan to continue budesonide 9 mg for 12 week course, advised patient to take for additional one month Start Pepto-Bismol 3 tablets 3 times daily Cholestyramine 4 g daily  Continue to avoid NSAIDs  Return in 1 month   K. Scherry Ran , MD [phone] Mon-Fri 8a-5p [phone] after 5p, weekends, holidays  CC: No ref. provider found

## 2016-06-26 ENCOUNTER — Ambulatory Visit: Payer: PPO | Admitting: Gastroenterology

## 2016-11-22 DIAGNOSIS — M25562 Pain in left knee: Secondary | ICD-10-CM | POA: Diagnosis not present

## 2016-11-22 DIAGNOSIS — Z23 Encounter for immunization: Secondary | ICD-10-CM | POA: Diagnosis not present

## 2016-11-22 DIAGNOSIS — K52831 Collagenous colitis: Secondary | ICD-10-CM | POA: Diagnosis not present

## 2016-11-22 DIAGNOSIS — F325 Major depressive disorder, single episode, in full remission: Secondary | ICD-10-CM | POA: Diagnosis not present

## 2016-11-22 DIAGNOSIS — E559 Vitamin D deficiency, unspecified: Secondary | ICD-10-CM | POA: Diagnosis not present

## 2016-11-22 DIAGNOSIS — E876 Hypokalemia: Secondary | ICD-10-CM | POA: Diagnosis not present

## 2016-11-28 DIAGNOSIS — Z1231 Encounter for screening mammogram for malignant neoplasm of breast: Secondary | ICD-10-CM | POA: Diagnosis not present

## 2016-12-02 DIAGNOSIS — M1712 Unilateral primary osteoarthritis, left knee: Secondary | ICD-10-CM | POA: Diagnosis not present

## 2016-12-02 DIAGNOSIS — M25562 Pain in left knee: Secondary | ICD-10-CM | POA: Diagnosis not present

## 2016-12-02 DIAGNOSIS — G8929 Other chronic pain: Secondary | ICD-10-CM | POA: Diagnosis not present

## 2017-03-17 DIAGNOSIS — F331 Major depressive disorder, recurrent, moderate: Secondary | ICD-10-CM | POA: Diagnosis not present

## 2017-07-11 DIAGNOSIS — F325 Major depressive disorder, single episode, in full remission: Secondary | ICD-10-CM | POA: Diagnosis not present

## 2017-07-11 DIAGNOSIS — L255 Unspecified contact dermatitis due to plants, except food: Secondary | ICD-10-CM | POA: Diagnosis not present

## 2017-07-11 DIAGNOSIS — E538 Deficiency of other specified B group vitamins: Secondary | ICD-10-CM | POA: Diagnosis not present

## 2017-07-16 ENCOUNTER — Encounter (INDEPENDENT_AMBULATORY_CARE_PROVIDER_SITE_OTHER): Payer: Self-pay | Admitting: Orthopaedic Surgery

## 2017-07-16 ENCOUNTER — Ambulatory Visit (INDEPENDENT_AMBULATORY_CARE_PROVIDER_SITE_OTHER): Payer: PPO | Admitting: Orthopaedic Surgery

## 2017-07-16 ENCOUNTER — Ambulatory Visit (INDEPENDENT_AMBULATORY_CARE_PROVIDER_SITE_OTHER): Payer: PPO

## 2017-07-16 DIAGNOSIS — G8929 Other chronic pain: Secondary | ICD-10-CM

## 2017-07-16 DIAGNOSIS — M1712 Unilateral primary osteoarthritis, left knee: Secondary | ICD-10-CM

## 2017-07-16 DIAGNOSIS — M25562 Pain in left knee: Secondary | ICD-10-CM | POA: Diagnosis not present

## 2017-07-16 NOTE — Progress Notes (Signed)
Office Visit Note   Patient: Renee [name]           Date of Birth: 08/11/1950           MRN: [id number] Visit Date: 07/16/2017              Requested by: [place]lerville[place]ulbright, [place] E, [place]-C [id number] [place], [place] [id number] PCP: Piedad ClimesFulbright, [place] E, [place]-C   Assessment & Plan: Visit Diagnoses:  1. Chronic pain of left knee   2. Unilateral primary osteoarthritis, left knee     Plan: At this point she is tried and failed all forms of conservative treatment.  Again she is had multiple arthroscopic interventions and steroid injections in her left knee.  She is work on activity modification and quad strengthening exercises.  Her pain is daily and at this point it is detrimental effect directives the living, quality of life, mobility.  She does wish to proceed witha total knee arthroplasty at this standpoint.  I went over knee model explained in detail what the surgery involves.  We had a long and thorough discussion about the risk and benefits of surgery.  We talked about her intraoperative and postoperative course.  I was then able to give her our surgery scheduler's card.  We will work on scheduling the surgery hopefully in the near future.  All questions and concerns were answered and addressed.  Follow-Up Instructions: Return for 2 weeks post-op.   Orders:  Orders Placed This Encounter  Procedures  . XR Knee 1-2 Views Left   No orders of the defined types were placed in this encounter.     Procedures: No procedures performed   Clinical Data: No additional findings.   Subjective: Chief Complaint  Patient presents with  . Left Knee - Pain  The patient is a very pleasant 67 year old female with a history of worsening left knee pain for over 2 years now.  She is actually had 2 separate arthroscopic surgeries over the years of her left knee with partial medial meniscectomies per her report.  She has had steroid injections in her left knee in the past and the used to work and  now they do not work at all.  She said the last one she had helped for only about 24 hours.  She gets swelling in her knee and a constant ache.  It hurts worse at night.  It hurts with weightbearing and pivoting activities as well.  She gets grinding in her knee with popping.  She is worked on activity modification and quad strengthening exercises and again has had 2 arthroscopic interventions and steroid injections already in that knee.  She is a very active individual and still works as well.  She is a smoker but is not a diabetic.   HPI  Review of Systems she currently denies any headache, chest pain, short of breath, fever, chills, nausea, vomiting.  Objective: Vital Signs: There were no vitals taken for this visit.  Physical Exam She is alert and oriented x3 and in no acute distress Ortho Exam Examination of her left knee shows significant patellofemoral crepitation.  She has varus malalignment of the knee with full range of motion.  She has significant medial joint line tenderness.  The knee is ligamentously stable. Specialty Comments:  No specialty comments available.  Imaging: Xr Knee 1-2 Views Left  Result Date: 07/16/2017 2 views of the left knee show varus malalignment with tricompartmental arthritic changes.  There is almost complete medial  joint space narrowing.  There is severe patellofemoral disease with periarticular osteophytes in all 3 compartments.    PMFS History: Patient Active Problem List   Diagnosis Date Noted  . Unilateral primary osteoarthritis, left knee 07/16/2017  . Collagenous colitis 05/24/2016   Past Medical History:  Diagnosis Date  . Collagenous colitis   . Depression   . Edema     Family History  Problem Relation Age of Onset  . Diabetes Mother   . Heart disease Mother   . Heart disease Father   . Breast cancer Maternal Grandmother   . Colon cancer Paternal Grandfather     Past Surgical History:  Procedure Laterality Date  . CESAREAN  SECTION    . GASTRIC BYPASS     Social History   Occupational History  . Occupation: LPN    Comment: retired  . Occupation: [employer]  Tobacco Use  . Smoking status: Current Some Day Smoker  . Smokeless tobacco: Never Used  . Tobacco comment: social  Substance and Sexual Activity  . Alcohol use: No  . Drug use: No  . Sexual activity: Not on file

## 2017-08-13 ENCOUNTER — Other Ambulatory Visit (INDEPENDENT_AMBULATORY_CARE_PROVIDER_SITE_OTHER): Payer: Self-pay | Admitting: Physician Assistant

## 2017-08-15 ENCOUNTER — Other Ambulatory Visit (INDEPENDENT_AMBULATORY_CARE_PROVIDER_SITE_OTHER): Payer: Self-pay

## 2017-08-18 NOTE — Pre-Procedure Instructions (Signed)
Renee [name]  08/18/2017      [facility] [id number] [place] [address] [facility] [address] [place] [id number] Phone: [phone] Fax: [phone]    Your procedure is scheduled on July 30  [facility] at [facility]30 A.M.  Call this number if you have problems the morning of surgery:  [phone]   Remember:  Do not eat or drink after midnight.    Take these medicines the morning of surgery with A SIP OF WATER  buPROPion (WELLBUTRIN XL)  citalopram (CELEXA)  7 days prior to surgery STOP taking any Aspirin(unless otherwise instructed by your surgeon), Aleve, Naproxen, Ibuprofen, Motrin, Advil, Goody's, BC's, all herbal medications, fish oil, and all vitamins    Do not wear jewelry, make-up or nail polish.  Do not wear lotions, powders, or perfumes, or deodorant.  Do not shave 48 hours prior to surgery.   Do not bring valuables to the hospital.  [facility] is not responsible for any belongings or valuables.  Contacts, dentures or bridgework may not be worn into surgery.  Leave your suitcase in the car.  After surgery it may be brought to your room.  For patients admitted to the hospital, discharge time will be determined by your treatment team.  Patients discharged the day of surgery will not be allowed to drive home.    Special instructions:   [facility]- Preparing For Surgery  Before surgery, you can play an important role. Because skin is not sterile, your skin needs to be as free of germs as possible. You can reduce the number of germs on your skin by washing with CHG (chlorahexidine gluconate) Soap before surgery.  CHG is an antiseptic cleaner which kills germs and bonds with the skin to continue killing germs even after washing.    Oral Hygiene is also important to reduce your risk of infection.  Remember - BRUSH YOUR TEETH THE MORNING OF SURGERY WITH YOUR REGULAR TOOTHPASTE  Please do not use if you  have an allergy to CHG or antibacterial soaps. If your skin becomes reddened/irritated stop using the CHG.  Do not shave (including legs and underarms) for at least 48 hours prior to first CHG shower. It is OK to shave your face.  Please follow these instructions carefully.   1. Shower the NIGHT BEFORE SURGERY and the MORNING OF SURGERY with CHG.   2. If you chose to wash your hair, wash your hair first as usual with your normal shampoo.  3. After you shampoo, rinse your hair and body thoroughly to remove the shampoo.  4. Use CHG as you would any other liquid soap. You can apply CHG directly to the skin and wash gently with a scrungie or a clean washcloth.   5. Apply the CHG Soap to your body ONLY FROM THE NECK DOWN.  Do not use on open wounds or open sores. Avoid contact with your eyes, ears, mouth and genitals (private parts). Wash Face and genitals (private parts)  with your normal soap.  6. Wash thoroughly, paying special attention to the area where your surgery will be performed.  7. Thoroughly rinse your body with warm water from the neck down.  8. DO NOT shower/wash with your normal soap after using and rinsing off the CHG Soap.  9. Pat yourself dry with a CLEAN TOWEL.  10. Wear CLEAN PAJAMAS to bed the night before surgery, wear  comfortable clothes the morning of surgery  11. Place CLEAN SHEETS on your bed the night of your first shower and DO NOT SLEEP WITH PETS.    Day of Surgery:  Do not apply any deodorants/lotions.  Please wear clean clothes to the hospital/surgery center.   Remember to brush your teeth WITH YOUR REGULAR TOOTHPASTE.    Please read over the following fact sheets that you were given.

## 2017-08-19 ENCOUNTER — Other Ambulatory Visit: Payer: Self-pay

## 2017-08-19 ENCOUNTER — Encounter (HOSPITAL_COMMUNITY)
Admission: RE | Admit: 2017-08-19 | Discharge: 2017-08-19 | Disposition: A | Discharge status: Home / Self Care | Payer: PPO | Source: Ambulatory Visit | Attending: Orthopaedic Surgery | Admitting: Orthopaedic Surgery

## 2017-08-19 ENCOUNTER — Encounter (HOSPITAL_COMMUNITY): Payer: Self-pay

## 2017-08-19 DIAGNOSIS — M1712 Unilateral primary osteoarthritis, left knee: Secondary | ICD-10-CM | POA: Insufficient documentation

## 2017-08-19 DIAGNOSIS — Z01818 Encounter for other preprocedural examination: Secondary | ICD-10-CM | POA: Insufficient documentation

## 2017-08-19 HISTORY — DX: Anemia, unspecified: D64.9

## 2017-08-19 HISTORY — DX: Unspecified osteoarthritis, unspecified site: M19.90

## 2017-08-19 LAB — BASIC METABOLIC PANEL
ANION GAP: 10 (ref 5–15)
BUN: 13 mg/dL (ref 8–23)
CALCIUM: 9.4 mg/dL (ref 8.9–10.3)
CO2: 26 mmol/L (ref 22–32)
CREATININE: 0.6 mg/dL (ref 0.44–1.00)
Chloride: 104 mmol/L (ref 98–111)
GFR calc Af Amer: >60 mL/min (ref >60)
GLUCOSE: 91 mg/dL (ref 70–99)
Potassium: 4.6 mmol/L (ref 3.5–5.1)
Sodium: 140 mmol/L (ref 135–145)

## 2017-08-19 LAB — CBC
HCT: 41 % (ref 36.0–46.0)
HEMOGLOBIN: 13.5 g/dL (ref 12.0–15.0)
MCH: 32.1 pg (ref 26.0–34.0)
MCHC: 32.9 g/dL (ref 30.0–36.0)
MCV: 97.4 fL (ref 78.0–100.0)
PLATELETS: 201 10*3/uL (ref 150–400)
RBC: 4.21 MIL/uL (ref 3.87–5.11)
RDW: 12.7 % (ref 11.5–15.5)
WBC: 5.9 10*3/uL (ref 4.0–10.5)

## 2017-08-19 LAB — SURGICAL PCR SCREEN
MRSA, PCR: NEGATIVE
Staphylococcus aureus: NEGATIVE

## 2017-08-19 NOTE — Progress Notes (Addendum)
PCP - Burnett Kanaris[place]ia Fulbright, [place] Cardiologist - denies  Chest x-ray - N/A EKG - 08/19/17 Stress Test - denies ECHO - 2017 (CE) Cardiac Cath - 2017 (CE)  Pt states that cath procedure was done due to high levels of potassium in lab results d/t colitis. Pt states cath was done to make sure her heart was not affected. Cath was clean.   Sleep Study - denies  Aspirin Instructions: denies use.  Anesthesia review: No  Patient denies shortness of breath, fever, cough and chest pain at PAT appointment   Patient verbalized understanding of instructions that were given to them at the PAT appointment. Patient was also instructed that they will need to review over the PAT instructions again at home before surgery.

## 2017-08-25 MED ORDER — SODIUM CHLORIDE 0.9 % IV SOLN
1000.0000 mg | INTRAVENOUS | Status: AC
  Administered 2017-08-26: 1000 mg via INTRAVENOUS
  Filled 2017-08-25: qty 1100

## 2017-08-26 ENCOUNTER — Inpatient Hospital Stay (HOSPITAL_COMMUNITY): Payer: PPO | Admitting: Certified Registered Nurse Anesthetist

## 2017-08-26 ENCOUNTER — Inpatient Hospital Stay (HOSPITAL_COMMUNITY)
Admission: RE | Admit: 2017-08-26 | Discharge: 2017-08-28 | DRG: 470 | Disposition: A | Discharge status: Home Health | Payer: PPO | Source: Ambulatory Visit | Attending: Orthopaedic Surgery | Admitting: Orthopaedic Surgery

## 2017-08-26 ENCOUNTER — Inpatient Hospital Stay (HOSPITAL_COMMUNITY): Payer: PPO

## 2017-08-26 ENCOUNTER — Other Ambulatory Visit: Payer: Self-pay

## 2017-08-26 ENCOUNTER — Encounter (HOSPITAL_COMMUNITY)
Admission: RE | Disposition: A | Discharge status: Home Health | Payer: Self-pay | Source: Ambulatory Visit | Attending: Orthopaedic Surgery

## 2017-08-26 ENCOUNTER — Encounter (HOSPITAL_COMMUNITY): Payer: Self-pay

## 2017-08-26 DIAGNOSIS — M1712 Unilateral primary osteoarthritis, left knee: Principal | ICD-10-CM | POA: Diagnosis present

## 2017-08-26 DIAGNOSIS — Z8249 Family history of ischemic heart disease and other diseases of the circulatory system: Secondary | ICD-10-CM

## 2017-08-26 DIAGNOSIS — Z800 Family history of malignant neoplasm of digestive organs: Secondary | ICD-10-CM

## 2017-08-26 DIAGNOSIS — Z833 Family history of diabetes mellitus: Secondary | ICD-10-CM

## 2017-08-26 DIAGNOSIS — F17210 Nicotine dependence, cigarettes, uncomplicated: Secondary | ICD-10-CM | POA: Diagnosis present

## 2017-08-26 DIAGNOSIS — F329 Major depressive disorder, single episode, unspecified: Secondary | ICD-10-CM | POA: Diagnosis not present

## 2017-08-26 DIAGNOSIS — K52831 Collagenous colitis: Secondary | ICD-10-CM | POA: Diagnosis not present

## 2017-08-26 DIAGNOSIS — Z96652 Presence of left artificial knee joint: Secondary | ICD-10-CM | POA: Diagnosis not present

## 2017-08-26 DIAGNOSIS — Z471 Aftercare following joint replacement surgery: Secondary | ICD-10-CM | POA: Diagnosis not present

## 2017-08-26 DIAGNOSIS — Z888 Allergy status to other drugs, medicaments and biological substances status: Secondary | ICD-10-CM

## 2017-08-26 DIAGNOSIS — Z803 Family history of malignant neoplasm of breast: Secondary | ICD-10-CM | POA: Diagnosis not present

## 2017-08-26 DIAGNOSIS — M25562 Pain in left knee: Secondary | ICD-10-CM | POA: Diagnosis present

## 2017-08-26 DIAGNOSIS — G8918 Other acute postprocedural pain: Secondary | ICD-10-CM | POA: Diagnosis not present

## 2017-08-26 HISTORY — PX: TOTAL KNEE ARTHROPLASTY: SHX125

## 2017-08-26 SURGERY — ARTHROPLASTY, KNEE, TOTAL
Anesthesia: Regional | Site: Knee | Laterality: Left

## 2017-08-26 MED ORDER — FENTANYL CITRATE (PF) 100 MCG/2ML IJ SOLN
INTRAMUSCULAR | Status: AC
  Filled 2017-08-26: qty 2

## 2017-08-26 MED ORDER — PROPOFOL 10 MG/ML IV BOLUS
INTRAVENOUS | Status: DC | PRN
  Administered 2017-08-26: 30 mg via INTRAVENOUS

## 2017-08-26 MED ORDER — OXYCODONE HCL 5 MG PO TABS
ORAL_TABLET | ORAL | Status: AC
  Administered 2017-08-27: 10 mg via ORAL
  Filled 2017-08-26: qty 1

## 2017-08-26 MED ORDER — PANTOPRAZOLE SODIUM 40 MG PO TBEC
40.0000 mg | DELAYED_RELEASE_TABLET | Freq: Every day | ORAL | Status: DC
  Administered 2017-08-26 – 2017-08-27 (×2): 40 mg via ORAL
  Filled 2017-08-26 (×3): qty 1

## 2017-08-26 MED ORDER — ONDANSETRON HCL 4 MG/2ML IJ SOLN
INTRAMUSCULAR | Status: AC
  Filled 2017-08-26: qty 2

## 2017-08-26 MED ORDER — BUPROPION HCL ER (XL) 300 MG PO TB24
300.0000 mg | ORAL_TABLET | Freq: Every day | ORAL | Status: DC
  Administered 2017-08-27 – 2017-08-28 (×2): 300 mg via ORAL
  Filled 2017-08-26 (×2): qty 1

## 2017-08-26 MED ORDER — DEXAMETHASONE SODIUM PHOSPHATE 10 MG/ML IJ SOLN
INTRAMUSCULAR | Status: DC | PRN
  Administered 2017-08-26: 10 mg via INTRAVENOUS

## 2017-08-26 MED ORDER — LACTATED RINGERS IV SOLN
INTRAVENOUS | Status: DC | PRN
  Administered 2017-08-26: 08:00:00 via INTRAVENOUS

## 2017-08-26 MED ORDER — SODIUM CHLORIDE 0.9 % IV SOLN
INTRAVENOUS | Status: DC
  Administered 2017-08-26: 16:00:00 via INTRAVENOUS

## 2017-08-26 MED ORDER — FENTANYL CITRATE (PF) 100 MCG/2ML IJ SOLN: 25.0000 ug | INTRAMUSCULAR | Status: DC | PRN

## 2017-08-26 MED ORDER — DIPHENHYDRAMINE HCL 12.5 MG/5ML PO ELIX: 12.5000 mg | ORAL_SOLUTION | ORAL | Status: DC | PRN

## 2017-08-26 MED ORDER — ONDANSETRON HCL 4 MG/2ML IJ SOLN: 4.0000 mg | Freq: Once | INTRAMUSCULAR | Status: DC | PRN

## 2017-08-26 MED ORDER — PHENOL 1.4 % MT LIQD: 1.0000 | OROMUCOSAL | Status: DC | PRN

## 2017-08-26 MED ORDER — METHOCARBAMOL 500 MG PO TABS
ORAL_TABLET | ORAL | Status: AC
  Administered 2017-08-27: 500 mg via ORAL
  Filled 2017-08-26: qty 1

## 2017-08-26 MED ORDER — DEXAMETHASONE SODIUM PHOSPHATE 10 MG/ML IJ SOLN
INTRAMUSCULAR | Status: AC
  Filled 2017-08-26: qty 1

## 2017-08-26 MED ORDER — FENTANYL CITRATE (PF) 250 MCG/5ML IJ SOLN
INTRAMUSCULAR | Status: DC | PRN
  Administered 2017-08-26 (×3): 25 ug via INTRAVENOUS
  Administered 2017-08-26: 50 ug via INTRAVENOUS
  Administered 2017-08-26: 25 ug via INTRAVENOUS

## 2017-08-26 MED ORDER — CITALOPRAM HYDROBROMIDE 40 MG PO TABS
40.0000 mg | ORAL_TABLET | Freq: Every day | ORAL | Status: DC
  Administered 2017-08-27 – 2017-08-28 (×2): 40 mg via ORAL
  Filled 2017-08-26 (×2): qty 1

## 2017-08-26 MED ORDER — ONDANSETRON HCL 4 MG PO TABS: 4.0000 mg | ORAL_TABLET | Freq: Four times a day (QID) | ORAL | Status: DC | PRN

## 2017-08-26 MED ORDER — MIDAZOLAM HCL 2 MG/2ML IJ SOLN
INTRAMUSCULAR | Status: AC
  Filled 2017-08-26: qty 2

## 2017-08-26 MED ORDER — ALUM & MAG HYDROXIDE-SIMETH 200-200-20 MG/5ML PO SUSP: 30.0000 mL | ORAL | Status: DC | PRN

## 2017-08-26 MED ORDER — PROPOFOL 500 MG/50ML IV EMUL
INTRAVENOUS | Status: DC | PRN
  Administered 2017-08-26: 100 ug/kg/min via INTRAVENOUS

## 2017-08-26 MED ORDER — PROPOFOL 10 MG/ML IV BOLUS
INTRAVENOUS | Status: AC
  Filled 2017-08-26: qty 20

## 2017-08-26 MED ORDER — CEFAZOLIN SODIUM-DEXTROSE 2-4 GM/100ML-% IV SOLN
INTRAVENOUS | Status: AC
  Filled 2017-08-26: qty 100

## 2017-08-26 MED ORDER — CEFAZOLIN SODIUM-DEXTROSE 1-4 GM/50ML-% IV SOLN
1.0000 g | Freq: Four times a day (QID) | INTRAVENOUS | Status: AC
  Administered 2017-08-26 (×2): 1 g via INTRAVENOUS
  Filled 2017-08-26 (×2): qty 50

## 2017-08-26 MED ORDER — METHOCARBAMOL 500 MG PO TABS
500.0000 mg | ORAL_TABLET | Freq: Four times a day (QID) | ORAL | Status: DC | PRN
  Administered 2017-08-26 – 2017-08-27 (×5): 500 mg via ORAL
  Filled 2017-08-26 (×5): qty 1

## 2017-08-26 MED ORDER — FENTANYL CITRATE (PF) 100 MCG/2ML IJ SOLN: 100.0000 ug | Freq: Once | INTRAMUSCULAR | Status: DC

## 2017-08-26 MED ORDER — METOCLOPRAMIDE HCL 5 MG/ML IJ SOLN: 5.0000 mg | Freq: Three times a day (TID) | INTRAMUSCULAR | Status: DC | PRN

## 2017-08-26 MED ORDER — CHLORHEXIDINE GLUCONATE 4 % EX LIQD: 60.0000 mL | Freq: Once | CUTANEOUS | Status: DC

## 2017-08-26 MED ORDER — TRAZODONE HCL 50 MG PO TABS: 50.0000 mg | ORAL_TABLET | Freq: Every evening | ORAL | Status: DC | PRN

## 2017-08-26 MED ORDER — OXYCODONE HCL 5 MG PO TABS
10.0000 mg | ORAL_TABLET | ORAL | Status: DC | PRN
  Administered 2017-08-26: 10 mg via ORAL
  Administered 2017-08-27 (×3): 15 mg via ORAL
  Administered 2017-08-27: 10 mg via ORAL
  Administered 2017-08-28: 15 mg via ORAL
  Filled 2017-08-26: qty 2
  Filled 2017-08-26 (×2): qty 3
  Filled 2017-08-26: qty 2
  Filled 2017-08-26 (×2): qty 3

## 2017-08-26 MED ORDER — MENTHOL 3 MG MT LOZG: 1.0000 | LOZENGE | OROMUCOSAL | Status: DC | PRN

## 2017-08-26 MED ORDER — DOCUSATE SODIUM 100 MG PO CAPS
100.0000 mg | ORAL_CAPSULE | Freq: Two times a day (BID) | ORAL | Status: DC
  Administered 2017-08-26 – 2017-08-28 (×5): 100 mg via ORAL
  Filled 2017-08-26 (×5): qty 1

## 2017-08-26 MED ORDER — LACTATED RINGERS IV SOLN: INTRAVENOUS | Status: DC

## 2017-08-26 MED ORDER — ZOLPIDEM TARTRATE 5 MG PO TABS: 5.0000 mg | ORAL_TABLET | Freq: Every evening | ORAL | Status: DC | PRN

## 2017-08-26 MED ORDER — FERROUS SULFATE 325 (65 FE) MG PO TABS
325.0000 mg | ORAL_TABLET | Freq: Every day | ORAL | Status: DC
  Administered 2017-08-27 – 2017-08-28 (×2): 325 mg via ORAL
  Filled 2017-08-26 (×2): qty 1

## 2017-08-26 MED ORDER — 0.9 % SODIUM CHLORIDE (POUR BTL) OPTIME
TOPICAL | Status: DC | PRN
  Administered 2017-08-26: 1000 mL

## 2017-08-26 MED ORDER — SODIUM CHLORIDE 0.9 % IR SOLN
Status: DC | PRN
  Administered 2017-08-26: 3000 mL

## 2017-08-26 MED ORDER — METOCLOPRAMIDE HCL 5 MG PO TABS: 5.0000 mg | ORAL_TABLET | Freq: Three times a day (TID) | ORAL | Status: DC | PRN

## 2017-08-26 MED ORDER — ACETAMINOPHEN 325 MG PO TABS: 325.0000 mg | ORAL_TABLET | Freq: Four times a day (QID) | ORAL | Status: DC | PRN

## 2017-08-26 MED ORDER — ROPIVACAINE HCL 7.5 MG/ML IJ SOLN
INTRAMUSCULAR | Status: DC | PRN
  Administered 2017-08-26: 20 mL via PERINEURAL

## 2017-08-26 MED ORDER — FENTANYL CITRATE (PF) 250 MCG/5ML IJ SOLN
INTRAMUSCULAR | Status: AC
  Filled 2017-08-26: qty 5

## 2017-08-26 MED ORDER — POLYETHYLENE GLYCOL 3350 17 G PO PACK: 17.0000 g | PACK | Freq: Every day | ORAL | Status: DC | PRN

## 2017-08-26 MED ORDER — ONDANSETRON HCL 4 MG/2ML IJ SOLN
INTRAMUSCULAR | Status: DC | PRN
  Administered 2017-08-26: 4 mg via INTRAVENOUS

## 2017-08-26 MED ORDER — MIDAZOLAM HCL 2 MG/2ML IJ SOLN
INTRAMUSCULAR | Status: DC | PRN
  Administered 2017-08-26: 2 mg via INTRAVENOUS

## 2017-08-26 MED ORDER — MIDAZOLAM HCL 2 MG/2ML IJ SOLN
2.0000 mg | Freq: Once | INTRAMUSCULAR | Status: AC
  Administered 2017-08-26: 2 mg via INTRAVENOUS

## 2017-08-26 MED ORDER — OXYCODONE HCL 5 MG PO TABS
5.0000 mg | ORAL_TABLET | ORAL | Status: DC | PRN
  Administered 2017-08-26: 5 mg via ORAL

## 2017-08-26 MED ORDER — ASPIRIN 81 MG PO CHEW
81.0000 mg | CHEWABLE_TABLET | Freq: Two times a day (BID) | ORAL | Status: DC
  Administered 2017-08-26 – 2017-08-28 (×4): 81 mg via ORAL
  Filled 2017-08-26 (×4): qty 1

## 2017-08-26 MED ORDER — MIDAZOLAM HCL 2 MG/2ML IJ SOLN
INTRAMUSCULAR | Status: AC
  Administered 2017-08-26: 2 mg via INTRAVENOUS
  Filled 2017-08-26: qty 2

## 2017-08-26 MED ORDER — HYDROCHLOROTHIAZIDE 25 MG PO TABS
25.0000 mg | ORAL_TABLET | Freq: Every day | ORAL | Status: DC
  Administered 2017-08-26 – 2017-08-28 (×3): 25 mg via ORAL
  Filled 2017-08-26 (×3): qty 1

## 2017-08-26 MED ORDER — PHENYLEPHRINE 40 MCG/ML (10ML) SYRINGE FOR IV PUSH (FOR BLOOD PRESSURE SUPPORT)
PREFILLED_SYRINGE | INTRAVENOUS | Status: AC
  Filled 2017-08-26: qty 10

## 2017-08-26 MED ORDER — POTASSIUM 99 MG PO TABS: 99.0000 mg | ORAL_TABLET | Freq: Every day | ORAL | Status: DC

## 2017-08-26 MED ORDER — METHOCARBAMOL 1000 MG/10ML IJ SOLN
500.0000 mg | Freq: Four times a day (QID) | INTRAVENOUS | Status: DC | PRN
  Filled 2017-08-26: qty 5

## 2017-08-26 MED ORDER — ONDANSETRON HCL 4 MG/2ML IJ SOLN: 4.0000 mg | Freq: Four times a day (QID) | INTRAMUSCULAR | Status: DC | PRN

## 2017-08-26 MED ORDER — CEFAZOLIN SODIUM-DEXTROSE 2-4 GM/100ML-% IV SOLN
2.0000 g | INTRAVENOUS | Status: AC
  Administered 2017-08-26: 2 g via INTRAVENOUS

## 2017-08-26 MED ORDER — HYDROMORPHONE HCL 1 MG/ML IJ SOLN
0.5000 mg | INTRAMUSCULAR | Status: DC | PRN
  Administered 2017-08-26 – 2017-08-28 (×8): 1 mg via INTRAVENOUS
  Filled 2017-08-26 (×9): qty 1

## 2017-08-26 MED ORDER — BUPIVACAINE IN DEXTROSE 0.75-8.25 % IT SOLN
INTRATHECAL | Status: DC | PRN
  Administered 2017-08-26: 1.5 mL via INTRATHECAL

## 2017-08-26 SURGICAL SUPPLY — 70 items
BANDAGE ACE 6X5 VEL STRL LF (GAUZE/BANDAGES/DRESSINGS) ×3 IMPLANT
BANDAGE ESMARK 6X9 LF (GAUZE/BANDAGES/DRESSINGS) ×1 IMPLANT
BASEPLATE TIBIAL TRIATHALON 3 (Plate) ×3 IMPLANT
BEARIN INSERT TIBIAL 11 (Orthopedic Implant) ×2 IMPLANT
BEARIN INSERT TIBIAL 11MM (Orthopedic Implant) ×1 IMPLANT
BEARING INSERT TIBIAL 11 (Orthopedic Implant) ×1 IMPLANT
BENZOIN TINCTURE PRP APPL 2/3 (GAUZE/BANDAGES/DRESSINGS) ×3 IMPLANT
BLADE SAG 18X100X1.27 (BLADE) ×3 IMPLANT
BNDG ESMARK 6X9 LF (GAUZE/BANDAGES/DRESSINGS) ×3
BOWL SMART MIX CTS (DISPOSABLE) ×3 IMPLANT
CEMENT BONE SIMPLEX SPEEDSET (Cement) ×6 IMPLANT
CLOSURE STERI-STRIP 1/2X4 (GAUZE/BANDAGES/DRESSINGS) ×1
CLOSURE WOUND 1/2 X4 (GAUZE/BANDAGES/DRESSINGS)
CLSR STERI-STRIP ANTIMIC 1/2X4 (GAUZE/BANDAGES/DRESSINGS) ×2 IMPLANT
COVER SURGICAL LIGHT HANDLE (MISCELLANEOUS) ×3 IMPLANT
CUFF TOURNIQUET SINGLE 34IN LL (TOURNIQUET CUFF) ×3 IMPLANT
CUFF TOURNIQUET SINGLE 44IN (TOURNIQUET CUFF) IMPLANT
DRAPE EXTREMITY T 121X128X90 (DRAPE) ×3 IMPLANT
DRAPE HALF SHEET 40X57 (DRAPES) ×3 IMPLANT
DRAPE U-SHAPE 47X51 STRL (DRAPES) ×3 IMPLANT
DRSG PAD ABDOMINAL 8X10 ST (GAUZE/BANDAGES/DRESSINGS) ×6 IMPLANT
DURAPREP 26ML APPLICATOR (WOUND CARE) ×3 IMPLANT
ELECT CAUTERY BLADE 6.4 (BLADE) ×3 IMPLANT
ELECT REM PT RETURN 9FT ADLT (ELECTROSURGICAL) ×3
ELECTRODE REM PT RTRN 9FT ADLT (ELECTROSURGICAL) ×1 IMPLANT
FACESHIELD WRAPAROUND (MASK) ×6 IMPLANT
FEMORAL PEG DISTAL FIXATION (Orthopedic Implant) ×3 IMPLANT
FEMORAL TRIATH POST STAB  SZ3 (Orthopedic Implant) ×2 IMPLANT
FEMORAL TRIATH POST STAB SZ3 (Orthopedic Implant) ×1 IMPLANT
GAUZE SPONGE 4X4 12PLY STRL (GAUZE/BANDAGES/DRESSINGS) ×3 IMPLANT
GAUZE XEROFORM 1X8 LF (GAUZE/BANDAGES/DRESSINGS) ×3 IMPLANT
GLOVE BIOGEL PI IND STRL 8 (GLOVE) ×2 IMPLANT
GLOVE BIOGEL PI INDICATOR 8 (GLOVE) ×4
GLOVE ORTHO TXT STRL SZ7.5 (GLOVE) ×3 IMPLANT
GLOVE SURG ORTHO 8.0 STRL STRW (GLOVE) ×3 IMPLANT
GOWN STRL REUS W/ TWL LRG LVL3 (GOWN DISPOSABLE) IMPLANT
GOWN STRL REUS W/ TWL XL LVL3 (GOWN DISPOSABLE) ×2 IMPLANT
GOWN STRL REUS W/TWL LRG LVL3 (GOWN DISPOSABLE)
GOWN STRL REUS W/TWL XL LVL3 (GOWN DISPOSABLE) ×4
HANDPIECE INTERPULSE COAX TIP (DISPOSABLE) ×2
IMMOBILIZER KNEE 22 UNIV (SOFTGOODS) ×3 IMPLANT
KIT BASIN OR (CUSTOM PROCEDURE TRAY) ×3 IMPLANT
KIT TURNOVER KIT B (KITS) ×3 IMPLANT
MANIFOLD NEPTUNE II (INSTRUMENTS) ×3 IMPLANT
NDL SAFETY ECLIPSE 18X1.5 (NEEDLE) IMPLANT
NEEDLE HYPO 18GX1.5 SHARP (NEEDLE)
NS IRRIG 1000ML POUR BTL (IV SOLUTION) ×3 IMPLANT
PACK TOTAL JOINT (CUSTOM PROCEDURE TRAY) ×3 IMPLANT
PAD ABD 8X10 STRL (GAUZE/BANDAGES/DRESSINGS) ×6 IMPLANT
PAD ARMBOARD 7.5X6 YLW CONV (MISCELLANEOUS) ×3 IMPLANT
PADDING CAST COTTON 6X4 STRL (CAST SUPPLIES) ×3 IMPLANT
PATELLA TRIATHLON SZ 29 9 MM (Orthopedic Implant) ×3 IMPLANT
SET HNDPC FAN SPRY TIP SCT (DISPOSABLE) ×1 IMPLANT
SET PAD KNEE POSITIONER (MISCELLANEOUS) ×3 IMPLANT
STAPLER VISISTAT 35W (STAPLE) ×3 IMPLANT
STRIP CLOSURE SKIN 1/2X4 (GAUZE/BANDAGES/DRESSINGS) IMPLANT
SUCTION FRAZIER HANDLE 10FR (MISCELLANEOUS) ×2
SUCTION TUBE FRAZIER 10FR DISP (MISCELLANEOUS) ×1 IMPLANT
SUT MNCRL AB 4-0 PS2 18 (SUTURE) IMPLANT
SUT VIC AB 0 CT1 27 (SUTURE) ×2
SUT VIC AB 0 CT1 27XBRD ANBCTR (SUTURE) ×1 IMPLANT
SUT VIC AB 1 CT1 27 (SUTURE) ×4
SUT VIC AB 1 CT1 27XBRD ANBCTR (SUTURE) ×2 IMPLANT
SUT VIC AB 2-0 CT1 27 (SUTURE) ×6
SUT VIC AB 2-0 CT1 TAPERPNT 27 (SUTURE) ×3 IMPLANT
SYR 50ML LL SCALE MARK (SYRINGE) IMPLANT
TOWEL OR 17X24 6PK STRL BLUE (TOWEL DISPOSABLE) ×3 IMPLANT
TOWEL OR 17X26 10 PK STRL BLUE (TOWEL DISPOSABLE) ×3 IMPLANT
TRAY CATH 16FR W/PLASTIC CATH (SET/KITS/TRAYS/PACK) IMPLANT
WRAP KNEE MAXI GEL POST OP (GAUZE/BANDAGES/DRESSINGS) ×3 IMPLANT

## 2017-08-26 NOTE — Progress Notes (Signed)
Orthopedic Tech Progress Note Patient Details:  [facility]Donna [name] 01/24/1951 [id number]  CPM Left Knee CPM Left Knee: On Left Knee Flexion (Degrees): 90 Left Knee Extension (Degrees): 0 Additional Comments: trapeze bar patient helper  Post Interventions Patient Tolerated: Well Instructions Provided: Care of device  [name], Renee [name] 08/26/2017, 1:26 PM

## 2017-08-26 NOTE — Anesthesia Preprocedure Evaluation (Addendum)
Anesthesia Evaluation  Patient identified by MRN, date of birth, ID band Patient awake    Reviewed: Allergy & Precautions, NPO status , Patient's Chart, lab work & pertinent test results  Airway Mallampati: I  TM Distance: >3 FB Neck ROM: Full    Dental no notable dental hx. (+) Teeth Intact, Dental Advisory Given   Pulmonary Current Smoker,    Pulmonary exam normal breath sounds clear to auscultation       Cardiovascular negative cardio ROS Normal cardiovascular exam Rhythm:Regular Rate:Normal  ECG: NSR, rate 64   Neuro/Psych PSYCHIATRIC DISORDERS Depression negative neurological ROS     GI/Hepatic negative GI ROS, Neg liver ROS,   Endo/Other  negative endocrine ROS  Renal/GU negative Renal ROS     Musculoskeletal  (+) Arthritis , Osteoarthritis,    Abdominal   Peds  Hematology negative hematology ROS (+)   Anesthesia Other Findings  Osteoarthritis left knee  Reproductive/Obstetrics                           Anesthesia Physical Anesthesia Plan  ASA: II  Anesthesia Plan: Spinal and Regional   Post-op Pain Management:  Regional for Post-op pain   Induction:   PONV Risk Score and Plan: 1 and Ondansetron, Dexamethasone, Propofol infusion, Midazolam and Treatment may vary due to age or medical condition  Airway Management Planned: Natural Airway  Additional Equipment:   Intra-op Plan:   Post-operative Plan:   Informed Consent: I have reviewed the patients History and Physical, chart, labs and discussed the procedure including the risks, benefits and alternatives for the proposed anesthesia with the patient or authorized representative who has indicated his/her understanding and acceptance.   Dental advisory given  Plan Discussed with: CRNA  Anesthesia Plan Comments:         Anesthesia Quick Evaluation

## 2017-08-26 NOTE — Progress Notes (Signed)
Orthopedic Tech Progress Note Patient Details:  Renee LinesDonna M [name] 05/14/1950 [id number]  Patient ID: Renee Linesonna M Foley, female   DOB: 10/01/1950, 67 y.o.   MRN: [id number]   [name], [name] 08/26/2017, 1:27 PM Viewed order from doctor's order list

## 2017-08-26 NOTE — Anesthesia Procedure Notes (Signed)
Spinal  Patient location during procedure: OR Start time: 08/26/2017 9:40 AM End time: 08/26/2017 9:50 AM Staffing Anesthesiologist: Leonides GrillsEllender, Ryan P, MD Performed: anesthesiologist  Preanesthetic Checklist Completed: patient identified, surgical consent, pre-op evaluation, timeout performed, IV checked, risks and benefits discussed and monitors and equipment checked Spinal Block Patient position: sitting Prep: DuraPrep Patient monitoring: cardiac monitor, continuous pulse ox and blood pressure Approach: midline Location: L4-5 Injection technique: single-shot Needle Needle type: Pencan  Needle gauge: 24 G Needle length: 9 cm Assessment Sensory level: T10 Additional Notes Functioning IV was confirmed and monitors were applied. Sterile prep and drape, including hand hygiene and sterile gloves were used. The patient was positioned and the spine was prepped. The skin was anesthetized with lidocaine.  Free flow of clear CSF was obtained on the third attempt prior to injecting local anesthetic into the CSF.  The spinal needle aspirated freely following injection.  The needle was carefully withdrawn.  The patient tolerated the procedure well.

## 2017-08-26 NOTE — Transfer of Care (Signed)
Immediate Anesthesia Transfer of Care Note  Patient: Renee [name]  Procedure(s) Performed: LEFT TOTAL KNEE ARTHROPLASTY (Left Knee)  Patient Location: PACU  Anesthesia Type:MAC and Spinal  Level of Consciousness: awake and patient cooperative  Airway & Oxygen Therapy: Patient Spontanous Breathing  Post-op Assessment: Report given to RN, Post -op Vital signs reviewed and stable and Patient moving all extremities X 4  Post vital signs: Reviewed and stable  Last Vitals:  Vitals Value Taken Time  BP 141/77 08/26/2017 11:26 AM  Temp    Pulse 59 08/26/2017 11:28 AM  Resp 11 08/26/2017 11:28 AM  SpO2 99 % 08/26/2017 11:28 AM  Vitals shown include unvalidated device data.  Last Pain:  Vitals:   08/26/17 0752  TempSrc: Oral  PainSc: 0-No pain      Patients Stated Pain Goal: 3 (29/47/65 4650)  Complications: No apparent anesthesia complications

## 2017-08-26 NOTE — Op Note (Signed)
NAME: Renee [name], Renee [name] MEDICAL RECORD ZO:[id number] ACCOUNT [id number] DATE OF BIRTH:August 12, 1950 FACILITY: [facility] LOCATION: [facility] PHYSICIAN:Shanee Batch Aretha Parrot, MD  OPERATIVE REPORT  DATE OF PROCEDURE:  08/26/2017  PREOPERATIVE DIAGNOSIS:  Primary osteoarthritis and degenerative joint disease, left knee.  POSTOPERATIVE DIAGNOSIS:  Primary osteoarthritis and degenerative joint disease, left knee.  PROCEDURE:  Left total knee arthroplasty.  IMPLANTS:  Stryker Triathlon cemented knee system with size 3 femur, size 3 tibial tray, 11 millimeter fixed bearing polyethylene insert, size 29 patellar button.  SURGEON:  Vanita Panda. Magnus Ivan, MD  ASSISTANT:  Richardean Canal, PA-C.  ANESTHESIA: 1.  Left lower extremity adductor canal block. 2.  Spinal.  ANTIBIOTICS:  Two grams IV Ancef.  ESTIMATED BLOOD LOSS:  100-150 mL.  COMPLICATIONS:  None.  INDICATIONS:  The patient is a very pleasant 67 year old female well known to me.  She has debilitating arthritis that is well documented of her left knee.  She has had at least 2 arthroscopic interventions of that knee over the years as well as  injections.  X-rays show varus malalignment with significant loss of cartilage throughout the knee and joint space narrowing as well.  Due to her continued left knee pain as well as the detrimental effect that is causing her mobility as well as her  activities of daily living and quality of life, she does wish to proceed with a total knee arthroplasty.  We told her in length about the risk of acute blood loss anemia, nerve or vessel injury, fracture, infection and DVT.  She understands our goals are  to decrease pain, improve mobility and overall improve quality of life.  DESCRIPTION OF PROCEDURE:  After informed consent was obtained, appropriate left knee was marked and anesthesia obtained an adductor canal block in the holding room.  She was then brought to the operating room and placed supine on the  operating table and  set up where spinal anesthesia was obtained.  She was then laid in the supine position.  A Foley catheter was placed.  A nonsterile tourniquet was placed around the upper left thigh.  Her left thigh, knee, leg and ankle were prepped and draped with  DuraPrep and sterile drapes.  A time-out was called to identify correct patient, correct left knee.  We then used an Esmarch to wrap that leg and tourniquet was inflated to 300 mm of pressure.  We then made a direct midline incision over the patella and  carried this proximally and distally.  We dissected down to the knee joint carried out a medial parapatellar arthrotomy finding very large joint effusion and significant arthritis throughout her left knee.  We removed remnants of ACL, PCL, medial and  lateral meniscus as well as periarticular osteophytes throughout the knee.  With the knee in a flexed position, we used the extramedullary cutting guide for making our proximal tibia cut.  We corrected for a neutral slope, correction of varus and valgus  and set our cutting block for taking 9 mm off the high side.  We made this cut without difficulty.  We then used an intramedullary drill for making our intramedullary cutting guide for the femur.  We set this for 5 degrees externally rotated for left  knee and a 10 mm distal femoral cut was made.  We made this cut without difficulty and brought the knee back down to full extension and a 9 mm extension block shows full extension.  We then went back to the femur and put our femoral sizing guide  based  off the epicondylar axis.  We set this for 3 degrees left and chose a size 3 femur.  We put our 4-in-1 cutting block for a 3 femur.  We made our anterior and posterior cuts followed by our chamfer cuts.  We then made our femoral box cut for a size 3.  We  then went back to the tibia and chose a size 3 tibial tray for coverage setting of rotation off the tibial tubercle and the femur.  We made our  keel punch off of this.  We then placed our trial size 3 tibia followed by our trial size 3 left femur.  We  trialed a 9 and then 11 mm polyethylene insert.  We were pleased with the stability and range of motion of the 11 mm insert.  We then made our patellar cut and drilled 3 holes for a size 29 patellar button.  We then removed all instrumentation from the  knee and irrigated the knee with normal saline solution using pulsatile lavage.  We dried the knee really well and mixed our cement.  We then cemented the real Stryker Triathlon tibial tray size 3 for a left knee followed by our real size 3 left femur.   We removed cement debris from the knee and then placed our 11 millimeter fixed bearing polyethylene insert and cemented our size 29 patellar button.  We then removed cement debris from the knee again and let the cement harden.  We then let the tourniquet  down.  Hemostasis obtained with electrocautery.  I put the knee through a range of motion.  I was pleased with stability.  We then closed the arthrotomy with interrupted #1 Vicryl suture followed by 0 Vicryl in the deep tissue, 2-0 Vicryl subcutaneous  tissue and interrupted staples on the skin.  Xeroform and well-padded sterile dressing was applied.  She was taken to recovery room in stable condition.  All final counts were correct.  There were no complications noted.  Of note, Rexene EdisonGil Clark, PA-C,  assisted the entire case.  Assistance was crucial for facilitating all aspects of this case.  TN/NUANCE  D:08/26/2017 T:08/26/2017 JOB:[id number]

## 2017-08-26 NOTE — Anesthesia Postprocedure Evaluation (Signed)
Anesthesia Post Note  Patient: Renee [name]  Procedure(s) Performed: LEFT TOTAL KNEE ARTHROPLASTY (Left Knee)     Patient location during evaluation: PACU Anesthesia Type: Regional and Spinal Level of consciousness: oriented and awake and alert Pain management: pain level controlled Vital Signs Assessment: post-procedure vital signs reviewed and stable Respiratory status: spontaneous breathing, respiratory function stable and patient connected to nasal cannula oxygen Cardiovascular status: blood pressure returned to baseline and stable Postop Assessment: no headache, no backache and no apparent nausea or vomiting Anesthetic complications: no    Last Vitals:  Vitals:   08/26/17 1415 08/26/17 1430  BP: 132/73 127/74  Pulse: (!) 56 (!) 57  Resp: 17 14  Temp:    SpO2: 96% 94%    Last Pain:  Vitals:   08/26/17 1400  TempSrc:   PainSc: Asleep                 Ryan P Ellender

## 2017-08-26 NOTE — Evaluation (Signed)
Physical Therapy Evaluation Patient Details Name: Renee [name] MRN: [id number] DOB: 04/06/1950 Today's Date: 08/26/2017   History of Present Illness  Renee FlingDonna [name] is a 67 y.o. F is s/p L TKR. PMH includes depression, edema, arthritis, and collagenous collitis.   Clinical Impression  Pt presents with problems above and deficits below. Pt was educated on precautions and supine HEP. Pt performed sit<>stand transfers and ambulated with RW and MinG-MinA. Pt tolerated activity well. Will continue to follow acutely to support independence, mobility, and safety.     Follow Up Recommendations Follow surgeon's recommendation for DC plan and follow-up therapies    Equipment Recommendations  3in1 (PT);Rolling walker with 5" wheels    Recommendations for Other Services OT consult     Precautions / Restrictions Precautions Precautions: Knee;Fall Precaution Booklet Issued: Yes (comment) Precaution Comments: Pt educated on supine HEP and precautions Restrictions Weight Bearing Restrictions: Yes LLE Weight Bearing: Weight bearing as tolerated      Mobility  Bed Mobility Overal bed mobility: Needs Assistance Bed Mobility: Supine to Sit     Supine to sit: Supervision     General bed mobility comments: Pt required assist to manage [name], but was Supervision to sit up at EOB. Pt reported no dizziness sitting up and had no perceived exertion with transfer.   Transfers Overall transfer level: Needs assistance Equipment used: Rolling walker (2 wheeled) Transfers: Sit to/from Stand Sit to Stand: Min guard;Min assist         General transfer comment: Pt was largely MinG, but required MinA for initiation and initial power up. Pt had no dizziness with transfer, and reported she felt "better" standing.   Ambulation/Gait Ambulation/Gait assistance: Min guard Gait Distance (Feet): 60 Feet Assistive device: Rolling walker (2 wheeled) Gait Pattern/deviations: Step-to pattern;Decreased step length  - right;Decreased stance time - left;Decreased stride length;Decreased weight shift to left;Antalgic;Scissoring Gait velocity: Decreased   General Gait Details: Pt was slow with ambulation, but had appropriate sequencing and safe use of RW. Pt had some guarding of L LE, but reported no buckling or numbness in L LE during gait. Pt had no visible LOB during turns.   Stairs            Wheelchair Mobility    Modified Rankin (Stroke Patients Only)       Balance Overall balance assessment: Needs assistance Sitting-balance support: No upper extremity supported;Feet supported Sitting balance-Leahy Scale: Good Sitting balance - Comments: Pt had no trouble lifting arms over head while PT put on gait belt   Standing balance support: Bilateral upper extremity supported Standing balance-Leahy Scale: Poor Standing balance comment: Pt reliant on RW for balance. Pt had no visible LOB while performing static standing.                             Pertinent Vitals/Pain Pain Assessment: 0-10 Pain Score: 6  Pain Location: L Knee Pain Descriptors / Indicators: Grimacing;Guarding Pain Intervention(s): Limited activity within patient's tolerance;Monitored during session;Repositioned    Home Living Family/patient expects to be discharged to:: Private residence Living Arrangements: Alone Available Help at Discharge: Family;Available PRN/intermittently Type of Home: House Home Access: Stairs to enter Entrance Stairs-Rails: Can reach both Entrance Stairs-Number of Steps: 4 Home Layout: One level Home Equipment: None Additional Comments: Pt reports her son will be able to take her home after surgery. Pt reports that family will be able to visit as she needs, and so will friends. 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 Pt reports no numbness or tingling.     Cervical / Trunk Assessment Cervical / Trunk Assessment: Normal  Communication   Communication: No difficulties  Cognition Arousal/Alertness: Awake/alert Behavior During Therapy: WFL for tasks assessed/performed Overall Cognitive Status: Within Functional Limits for tasks assessed                                        General Comments General comments (skin integrity, edema, etc.): Pt expresses a concern she won't be able to go back to work in time. Pt is eager to progress.     Exercises Total Joint Exercises Ankle Circles/Pumps: AROM;Both;20 reps;Supine Quad Sets: AROM;Left;10 reps;Supine Heel Slides: AROM;Left;10 reps;Supine   Assessment/Plan    PT Assessment Patient needs continued PT services  PT Problem List Decreased strength;Decreased range of motion;Decreased mobility;Decreased knowledge of use of DME;Decreased knowledge of precautions;Pain       PT Treatment Interventions DME instruction;Stair training;Gait training;Functional mobility training;Therapeutic activities;Therapeutic exercise;Patient/family education    PT Goals (Current goals can be found in the Care Plan section)  Acute Rehab PT Goals Patient Stated Goal: To go to [place]  PT Goal Formulation: With patient Time For Goal Achievement: 09/09/17 Potential to Achieve Goals: Good    Frequency 7X/week   Barriers to discharge        Co-evaluation               AM-PAC PT "6 Clicks" Daily Activity  Outcome Measure Difficulty turning over in bed (including adjusting bedclothes, sheets and blankets)?: A  Little Difficulty moving from lying on back to sitting on the side of the bed? : A Little Difficulty sitting down on and standing up from a chair with arms (e.g., wheelchair, bedside commode, etc,.)?: Unable Help needed moving to and from a bed to chair (including a wheelchair)?: A Little Help needed walking in hospital room?: A Little Help needed climbing 3-5 steps with a railing? : A Lot 6 Click Score: 15    End of Session Equipment Utilized During Treatment: Gait belt Activity Tolerance: Patient tolerated treatment well Patient left: in chair;with call bell/phone within reach;with chair alarm set Nurse Communication: Mobility status PT Visit Diagnosis: Other abnormalities of gait and mobility (R26.89);Muscle weakness (generalized) (M62.81);Difficulty in walking, not elsewhere classified (R26.2);Pain Pain - Right/Left: Left Pain - part of body: Knee    Time: 5784-6962 PT Time Calculation (min) (ACUTE ONLY): 30 min   Charges:   PT Evaluation $PT Eval Low Complexity: 1 Low PT Treatments $Gait Training: 8-22 mins      Demetria Pore, S-DPT Acute Care Rehab Student [phone]   08/26/2017, 6:00 PM

## 2017-08-26 NOTE — Anesthesia Procedure Notes (Signed)
Procedure Name: MAC Date/Time: 08/26/2017 9:45 AM Performed by: Harden Mo, CRNA Pre-anesthesia Checklist: Patient identified, Emergency Drugs available, Suction available, Patient being monitored and Timeout performed Patient Re-evaluated:Patient Re-evaluated prior to induction Oxygen Delivery Method: Simple face mask Preoxygenation: Pre-oxygenation with 100% oxygen Induction Type: IV induction Placement Confirmation: breath sounds checked- equal and bilateral and positive ETCO2 Dental Injury: Teeth and Oropharynx as per pre-operative assessment

## 2017-08-26 NOTE — Anesthesia Procedure Notes (Signed)
Anesthesia Regional Block: Adductor canal block   Pre-Anesthetic Checklist: ,, timeout performed, Correct Patient, Correct Site, Correct Laterality, Correct Procedure,, site marked, risks and benefits discussed, Surgical consent,  Pre-op evaluation,  At surgeon's request and post-op pain management  Laterality: Left  Prep: chloraprep       Needles:  Injection technique: Single-shot  Needle Type: Echogenic Stimulator Needle     Needle Length: 9cm  Needle Gauge: 21     Additional Needles:   Procedures:,,,, ultrasound used (permanent image in chart),,,,  Narrative:  Start time: 08/26/2017 8:40 AM End time: 08/26/2017 8:50 AM Injection made incrementally with aspirations every 5 mL.  Performed by: Personally  Anesthesiologist: Leonides GrillsEllender, Ryan P, MD  Additional Notes: Functioning IV was confirmed and monitors were applied.  A 90mm 21ga Arrow echogenic stimulator needle was used. Sterile prep, hand hygiene and sterile gloves were used.  Negative aspiration and negative test dose prior to incremental administration of local anesthetic. The patient tolerated the procedure well.

## 2017-08-26 NOTE — H&P (Signed)
TOTAL KNEE ADMISSION H&P  Patient is being admitted for left total knee arthroplasty.  Subjective:  Chief Complaint:left knee pain.  HPI: Renee LinesDonna M [name], 67 y.o. female, has a history of pain and functional disability in the left knee due to arthritis and has failed non-surgical conservative treatments for greater than 12 weeks to includeNSAID's and/or analgesics, corticosteriod injections, viscosupplementation injections, flexibility and strengthening excercises and activity modification.  Onset of symptoms was gradual, starting 2 years ago with gradually worsening course since that time. The patient noted prior procedures on the knee to include  menisectomy on the left knee(s).  Patient currently rates pain in the left knee(s) at 10 out of 10 with activity. Patient has night pain, worsening of pain with activity and weight bearing, pain that interferes with activities of daily living, pain with passive range of motion, crepitus and joint swelling.  Patient has evidence of subchondral sclerosis, periarticular osteophytes and joint space narrowing by imaging studies. There is no active infection.  Patient Active Problem List   Diagnosis Date Noted  . Unilateral primary osteoarthritis, left knee 07/16/2017  . Collagenous colitis 05/24/2016   Past Medical History:  Diagnosis Date  . Anemia   . Arthritis   . Collagenous colitis   . Depression   . Edema     Past Surgical History:  Procedure Laterality Date  . CARDIAC CATHETERIZATION  2017  . CESAREAN SECTION    . GASTRIC BYPASS      Current Facility-Administered Medications  Medication Dose Route Frequency Provider Last Rate Last Dose  . tranexamic acid (CYKLOKAPRON) 1,000 mg in sodium chloride 0.9 % 100 mL IVPB  1,000 mg Intravenous To OR Kathryne HitchBlackman, Christopher Y, MD       Current Outpatient Medications  Medication Sig Dispense Refill Last Dose  . buPROPion (WELLBUTRIN XL) 150 MG 24 hr tablet Take 300 mg by mouth daily.    Taking  .  citalopram (CELEXA) 40 MG tablet Take 40 mg by mouth daily.    Taking  . cyanocobalamin (,VITAMIN B-12,) 1000 MCG/ML injection Inject 1,000 mcg into the muscle every 30 (thirty) days.     . Ferrous Sulfate Dried (SLOW RELEASE IRON) 45 MG TBCR Take 45 mg by mouth daily.     . hydrochlorothiazide (HYDRODIURIL) 25 MG tablet Take 25 mg by mouth daily.     . Multiple Vitamins-Minerals (ADULT GUMMY PO) Take 1 tablet by mouth daily.     . Potassium 99 MG TABS Take 99 mg by mouth daily.     . Syringe/Needle, Disp, 27G X 5/8" 1 ML MISC Use once monthly for subcutaneous  B12 injections 1 each 11 Taking  . traZODone (DESYREL) 50 MG tablet TAKE 2 TABLETS BY MOUTH EVERY NIGHT AT BEDTIME   Taking   Allergies  Allergen Reactions  . Psyllium Anaphylaxis    Social History   Tobacco Use  . Smoking status: Current Every Day Smoker    Packs/day: 0.50    Types: Cigarettes  . Smokeless tobacco: Never Used  . Tobacco comment: social  Substance Use Topics  . Alcohol use: No    Family History  Problem Relation Age of Onset  . Diabetes Mother   . Heart disease Mother   . Heart disease Father   . Breast cancer Maternal Grandmother   . Colon cancer Paternal Grandfather      Review of Systems  Musculoskeletal: Positive for joint pain.  All other systems reviewed and are negative.   Objective:  Physical Exam  Constitutional: She is oriented to person, place, and time. She appears well-developed and well-nourished.  HENT:  Head: Normocephalic and atraumatic.  Eyes: Pupils are equal, round, and reactive to light. EOM are normal.  Neck: Normal range of motion. Neck supple.  Cardiovascular: Normal rate and regular rhythm.  Respiratory: Effort normal and breath sounds normal.  GI: Soft. Bowel sounds are normal.  Musculoskeletal:       Left knee: She exhibits decreased range of motion, swelling, effusion and abnormal alignment. Tenderness found. Medial joint line and lateral joint line tenderness  noted.  Neurological: She is alert and oriented to person, place, and time.  Skin: Skin is warm and dry.  Psychiatric: She has a normal mood and affect.    Vital signs in last 24 hours:    Labs:   Estimated body mass index is 25.72 kg/m as calculated from the following:   Height as of 08/19/17: 5\' 3"  (1.6 m).   Weight as of 08/19/17: 145 lb 3.2 oz (65.9 kg).   Imaging Review Plain radiographs demonstrate severe degenerative joint disease of the left knee(s). The overall alignment ismild varus. The bone quality appears to be excellent for age and reported activity level.   Preoperative templating of the joint replacement has been completed, documented, and submitted to the Operating Room personnel in order to optimize intra-operative equipment management.    Patient's anticipated LOS is less than 2 midnights, meeting these requirements: - Younger than 69 - Lives within 1 hour of care - Has a competent adult at home to recover with post-op recover - NO history of  - Chronic pain requiring opiods  - Diabetes  - Coronary Artery Disease  - Heart failure  - Heart attack  - Stroke  - DVT/VTE  - Cardiac arrhythmia  - Respiratory Failure/COPD  - Renal failure  - Anemia  - Advanced Liver disease        Assessment/Plan:  End stage arthritis, left knee   The patient history, physical examination, clinical judgment of the provider and imaging studies are consistent with end stage degenerative joint disease of the left knee(s) and total knee arthroplasty is deemed medically necessary. The treatment options including medical management, injection therapy arthroscopy and arthroplasty were discussed at length. The risks and benefits of total knee arthroplasty were presented and reviewed. The risks due to aseptic loosening, infection, stiffness, patella tracking problems, thromboembolic complications and other imponderables were discussed. The patient acknowledged the explanation,  agreed to proceed with the plan and consent was signed. Patient is being admitted for inpatient treatment for surgery, pain control, PT, OT, prophylactic antibiotics, VTE prophylaxis, progressive ambulation and ADL's and discharge planning. The patient is planning to be discharged home with home health services

## 2017-08-26 NOTE — Brief Op Note (Signed)
08/26/2017  11:05 AM  PATIENT:  Lajuan Linesonna M Wish  67 y.o. female  PRE-OPERATIVE DIAGNOSIS:  osteoarthritis left knee  POST-OPERATIVE DIAGNOSIS:  osteoarthritis left knee  PROCEDURE:  Procedure(s): LEFT TOTAL KNEE ARTHROPLASTY (Left)  SURGEON:  Surgeon(s) and Role:    Kathryne Hitch* Kahlie Deutscher Y, MD - Primary  PHYSICIAN ASSISTANT: Rexene EdisonGil Clark, PA-C  ANESTHESIA:   regional and spinal  EBL:  150 mL   COUNTS:  YES  TOURNIQUET:   Total Tourniquet Time Documented: Thigh (Left) - 47 minutes Total: Thigh (Left) - 47 minutes   DICTATION: .Other Dictation: Dictation Number [id number]  PLAN OF CARE: Admit to inpatient   PATIENT DISPOSITION:  PACU - hemodynamically stable.   Delay start of Pharmacological VTE agent (>24hrs) due to surgical blood loss or risk of bleeding: no

## 2017-08-27 ENCOUNTER — Encounter (HOSPITAL_COMMUNITY): Payer: Self-pay | Admitting: Orthopaedic Surgery

## 2017-08-27 LAB — CBC
HCT: 33.4 % — ABNORMAL LOW (ref 36.0–46.0)
HEMOGLOBIN: 11.3 g/dL — AB (ref 12.0–15.0)
MCH: 32.2 pg (ref 26.0–34.0)
MCHC: 33.8 g/dL (ref 30.0–36.0)
MCV: 95.2 fL (ref 78.0–100.0)
PLATELETS: 204 10*3/uL (ref 150–400)
RBC: 3.51 MIL/uL — ABNORMAL LOW (ref 3.87–5.11)
RDW: 12.3 % (ref 11.5–15.5)
WBC: 9.7 10*3/uL (ref 4.0–10.5)

## 2017-08-27 LAB — BASIC METABOLIC PANEL
Anion gap: 7 (ref 5–15)
BUN: 13 mg/dL (ref 8–23)
CALCIUM: 9 mg/dL (ref 8.9–10.3)
CO2: 30 mmol/L (ref 22–32)
CREATININE: 0.47 mg/dL (ref 0.44–1.00)
Chloride: 98 mmol/L (ref 98–111)
GFR calc Af Amer: >60 mL/min (ref >60)
GFR calc non Af Amer: >60 mL/min (ref >60)
GLUCOSE: 85 mg/dL (ref 70–99)
Potassium: 4 mmol/L (ref 3.5–5.1)
Sodium: 135 mmol/L (ref 135–145)

## 2017-08-27 NOTE — Progress Notes (Signed)
Physical Therapy Treatment Patient Details Name: Renee [name] MRN: [id number] DOB: January 23, 1951 Today's Date: 08/27/2017    History of Present Illness Renee [name] is a 67 y.o. F is s/p L TKR. PMH includes depression, edema, arthritis, and collagenous collitis.     PT Comments    Pt reported pain at 8/10 this session and requesting pain meds, but agreeable to participate with therapy. Completed supine HEP and ambulated in hall. Plan to train on seated HEP and stairs if appropriate for next session.    Follow Up Recommendations  Follow surgeon's recommendation for DC plan and follow-up therapies     Equipment Recommendations  3in1 (PT);Rolling walker with 5" wheels    Recommendations for Other Services OT consult     Precautions / Restrictions Precautions Precautions: Knee;Fall Precaution Booklet Issued: Yes (comment) Precaution Comments: Pt educated on supine HEP and precautions Restrictions Weight Bearing Restrictions: Yes LLE Weight Bearing: Weight bearing as tolerated    Mobility  Bed Mobility Overal bed mobility: Needs Assistance Bed Mobility: Sit to Supine;Supine to Sit     Supine to sit: Supervision Sit to supine: Supervision   General bed mobility comments: Good technique for bed mobilities, using R LE to hook L LE and progress it on and off bed. SUpervision for safety and lines.  Transfers Overall transfer level: Needs assistance Equipment used: Rolling walker (2 wheeled) Transfers: Sit to/from Stand Sit to Stand: Min guard         General transfer comment: min guard from bed and BSC. Cues for hand placement.   Ambulation/Gait Ambulation/Gait assistance: Min guard Gait Distance (Feet): 60 Feet Assistive device: Rolling walker (2 wheeled) Gait Pattern/deviations: Step-to pattern;Decreased step length - right;Decreased stance time - left;Decreased stride length;Decreased weight shift to left;Antalgic;Scissoring;Step-through pattern Gait velocity:  Decreased   General Gait Details: Pt able to progress to step through pattern with cues, however reverted back to step through pattern as pt fatigued and pain increased. Min guard for safety and cues for RW proximity.   Stairs             Wheelchair Mobility    Modified Rankin (Stroke Patients Only)       Balance Overall balance assessment: Needs assistance Sitting-balance support: No upper extremity supported;Feet supported Sitting balance-Leahy Scale: Good     Standing balance support: Bilateral upper extremity supported Standing balance-Leahy Scale: Poor Standing balance comment: Pt reliant on RW for balance. Pt had no visible LOB while performing static standing.                            Cognition Arousal/Alertness: Awake/alert Behavior During Therapy: WFL for tasks assessed/performed Overall Cognitive Status: Within Functional Limits for tasks assessed                                        Exercises Total Joint Exercises Ankle Circles/Pumps: AROM;Both;20 reps;Supine Quad Sets: AROM;Left;10 reps;Supine Towel Squeeze: AROM;Both;10 reps;Supine Short Arc Quad: AROM;Left;10 reps;Supine Heel Slides: AROM;Left;10 reps;Supine Hip ABduction/ADduction: AROM;Left;10 reps;Supine Straight Leg Raises: AROM;Left;10 reps;Supine Goniometric ROM: 74 degrees flexion in R knee.      General Comments        Pertinent Vitals/Pain Pain Assessment: 0-10 Pain Score: 8  Pain Location: L Knee Pain Descriptors / Indicators: Grimacing;Guarding;Sore;Spasm Pain Intervention(s): Monitored during session;Limited activity within patient's tolerance;Repositioned;Patient requesting pain meds-RN notified;RN gave pain meds  during session;Ice applied    Home Living Family/patient expects to be discharged to:: Private residence Living Arrangements: Alone Available Help at Discharge: Family;Available PRN/intermittently Type of Home: House Home Access: Stairs  to enter Entrance Stairs-Rails: Can reach both Home Layout: One level Home Equipment: None Additional Comments: Pt reports her son will be able to take her home after surgery. Pt reports that family will be able to visit as she needs, and so will friends. Pt reports no one can be there 24/7    Prior Function Level of Independence: Independent      Comments: Pt reports she had no difficulties with ADLs and was able to work at a [employer]   PT Goals (current goals can now be found in the care plan section) Acute Rehab PT Goals Patient Stated Goal: To decrease pain PT Goal Formulation: With patient Time For Goal Achievement: 09/09/17 Potential to Achieve Goals: Good Progress towards PT goals: Progressing toward goals    Frequency    7X/week      PT Plan Current plan remains appropriate    Co-evaluation              AM-PAC PT "6 Clicks" Daily Activity  Outcome Measure  Difficulty turning over in bed (including adjusting bedclothes, sheets and blankets)?: A Little Difficulty moving from lying on back to sitting on the side of the bed? : A Little Difficulty sitting down on and standing up from a chair with arms (e.g., wheelchair, bedside commode, etc,.)?: A Little Help needed moving to and from a bed to chair (including a wheelchair)?: A Little Help needed walking in hospital room?: A Little Help needed climbing 3-5 steps with a railing? : A Little 6 Click Score: 18    End of Session Equipment Utilized During Treatment: Gait belt Activity Tolerance: Patient tolerated treatment well Patient left: with call bell/phone within reach;in bed;in CPM Nurse Communication: Mobility status PT Visit Diagnosis: Other abnormalities of gait and mobility (R26.89);Muscle weakness (generalized) (M62.81);Difficulty in walking, not elsewhere classified (R26.2);Pain Pain - Right/Left: Left Pain - part of body: Knee     Time: 1454-1530 PT Time Calculation (min) (ACUTE ONLY): 36  min  Charges:  $Gait Training: 8-22 mins $Therapeutic Exercise: 8-22 mins                     Kallie LocksHannah Leane Loring, [place]PTA Pager [phone] Acute Rehab   Sheral ApleyHannah E Creta Dorame 08/27/2017, 3:40 PM

## 2017-08-27 NOTE — Progress Notes (Signed)
Subjective: 1 Day Post-Op Procedure(s) (LRB): LEFT TOTAL KNEE ARTHROPLASTY (Left) Patient reports pain as moderate.    Objective: Vital signs in last 24 hours: Temp:  [97.3 F (36.3 C)-98.3 F (36.8 C)] 98.2 F (36.8 C) (07/31 0355) Pulse Rate:  [49-67] 59 (07/31 0355) Resp:  [10-18] 16 (07/31 0355) BP: (119-163)/(65-91) 146/78 (07/31 0355) SpO2:  [94 %-100 %] 100 % (07/31 0355) Weight:  [145 lb 3.2 oz (65.9 kg)] 145 lb 3.2 oz (65.9 kg) (07/30 0752)  Intake/Output from previous day: 07/30 0701 - 07/31 0700 In: 721 [I.V.:700.7; IV Piggyback:20.3] Out: 2100 [Urine:1950; Blood:150] Intake/Output this shift: No intake/output data recorded.  Recent Labs    08/27/17 0447  HGB 11.3*   Recent Labs    08/27/17 0447  WBC 9.7  RBC 3.51*  HCT 33.4*  PLT 204   Recent Labs    08/27/17 0447  NA 135  K 4.0  CL 98  CO2 30  BUN 13  CREATININE 0.47  GLUCOSE 85  CALCIUM 9.0   No results for input(s): LABPT, INR in the last 72 hours.  Sensation intact distally Intact pulses distally Dorsiflexion/Plantar flexion intact Incision: dressing C/D/I No cellulitis present Compartment soft  Anticipated LOS equal to or greater than 2 midnights due to - Age 67 and older with one or more of the following:  - Obesity  - Expected need for hospital services (PT, OT, Nursing) required for safe  discharge  - Anticipated need for postoperative skilled nursing care or inpatient rehab  - Active co-morbidities: None OR   - Unanticipated findings during/Post Surgery: None  - Patient is a high risk of re-admission due to: None   Assessment/Plan: 1 Day Post-Op Procedure(s) (LRB): LEFT TOTAL KNEE ARTHROPLASTY (Left) Up with therapy Discharge home with home health next 1-2 days.    Kathryne HitchChristopher Y Challis Crill 08/27/2017, 7:46 AM

## 2017-08-27 NOTE — Evaluation (Signed)
Occupational Therapy Evaluation Patient Details Name: Renee [name] MRN: [id number] DOB: Jun 18, 1950 Today's Date: 08/27/2017    History of Present Illness Renee [name] is a 67 y.o. F is s/p L TKR. PMH includes depression, edema, arthritis, and collagenous collitis.    Clinical Impression   Pt is s/p TKA resulting in the deficits listed below (see OT Problem List).  Pt will benefit from skilled OT to increase their safety and independence with ADL and functional mobility for ADL to facilitate discharge to venue listed below.       Follow Up Recommendations  No OT follow up;Supervision - Intermittent    Equipment Recommendations  None recommended by OT    Recommendations for Other Services       Precautions / Restrictions Precautions Precautions: Knee;Fall Precaution Booklet Issued: Yes (comment) Precaution Comments: Pt educated on supine HEP and precautions Restrictions Weight Bearing Restrictions: Yes LLE Weight Bearing: Weight bearing as tolerated      Mobility Bed Mobility Overal bed mobility: Needs Assistance Bed Mobility: Sit to Supine       Sit to supine: Min assist      Transfers Overall transfer level: Needs assistance Equipment used: Rolling walker (2 wheeled) Transfers: Sit to/from Stand Sit to Stand: Min assist              Balance Overall balance assessment: Needs assistance Sitting-balance support: No upper extremity supported;Feet supported Sitting balance-Leahy Scale: Good     Standing balance support: Bilateral upper extremity supported Standing balance-Leahy Scale: Poor                             ADL either performed or assessed with clinical judgement   ADL Overall ADL's : Needs assistance/impaired Eating/Feeding: Set up;Sitting   Grooming: Set up;Sitting   Upper Body Bathing: Set up;Sitting   Lower Body Bathing: Moderate assistance;Sit to/from stand;Cueing for sequencing;Cueing for safety   Upper Body Dressing : Set  up;Sitting   Lower Body Dressing: Moderate assistance;Sit to/from stand;Cueing for sequencing;Cueing for safety   Toilet Transfer: Minimal assistance;Ambulation;Stand-pivot;Cueing for sequencing;Cueing for safety;RW;BSC   Toileting- Clothing Manipulation and Hygiene: Minimal assistance;Sit to/from stand;Cueing for sequencing;Cueing for safety               Vision Patient Visual Report: No change from baseline              Pertinent Vitals/Pain Pain Score: 4  Pain Location: L Knee Pain Descriptors / Indicators: Grimacing;Guarding;Sore Pain Intervention(s): Limited activity within patient's tolerance;Repositioned;Ice applied     Hand Dominance Right   Extremity/Trunk Assessment Upper Extremity Assessment Upper Extremity Assessment: Overall WFL for tasks assessed           Communication Communication Communication: No difficulties   Cognition Arousal/Alertness: Awake/alert Behavior During Therapy: WFL for tasks assessed/performed Overall Cognitive Status: Within Functional Limits for tasks assessed                                                Home Living Family/patient expects to be discharged to:: Private residence Living Arrangements: Alone Available Help at Discharge: Family;Available PRN/intermittently Type of Home: House Home Access: Stairs to enter [employer] of Steps: 4 Entrance Stairs-Rails: Can reach both Home Layout: One level     Bathroom Shower/Tub: Industrial/product designer Accessibility:  Yes   Home Equipment: None   Additional Comments: Pt reports her son will be able to take her home after surgery. Pt reports that family will be able to visit as she needs, and so will friends. Pt reports no one can be there 24/7      Prior Functioning/Environment Level of Independence: Independent        Comments: Pt reports she had no difficulties with ADLs and was able to work at a [employer]        OT Problem List: Decreased activity tolerance;Pain;Decreased knowledge of use of DME or AE      OT Treatment/Interventions: Self-care/ADL training;DME and/or AE instruction;Patient/family education;Therapeutic activities    OT Goals(Current goals can be found in the care plan section) Acute Rehab OT Goals Patient Stated Goal: To go to [place]  OT Goal Formulation: With patient Time For Goal Achievement: 09/03/17 ADL Goals Pt Will Perform Lower Body Bathing: with supervision;sit to/from stand Pt Will Perform Lower Body Dressing: with supervision;sit to/from stand Pt Will Transfer to Toilet: with supervision;ambulating;regular height toilet Pt Will Perform Toileting - Clothing Manipulation and hygiene: with supervision;sit to/from stand  OT Frequency: Min 2X/week   Barriers to [name]/C:               AM-PAC PT "6 Clicks" Daily Activity     Outcome Measure Help from another person eating meals?: None Help from another person taking care of personal grooming?: None Help from another person toileting, which includes using toliet, bedpan, or urinal?: A Little Help from another person bathing (including washing, rinsing, drying)?: A Little Help from another person to put on and taking off regular upper body clothing?: None Help from another person to put on and taking off regular lower body clothing?: A Little 6 Click Score: 21   End of Session  Activity Tolerance: Patient limited by fatigue Patient left: with call bell/phone within reach;in bed  OT Visit Diagnosis: Unsteadiness on feet (R26.81);Other abnormalities of gait and mobility (R26.89)                Time: [id number] OT Time Calculation (min): 15 min Charges:  OT General Charges $OT Visit: 1 Visit OT Evaluation $OT Eval Low Complexity: 1 Low  [place]uckportLori Fischer [name], [place]OT [phone]  Renee CoryREDDING, Renee [name] 08/27/2017, 2:02 PM

## 2017-08-27 NOTE — Progress Notes (Signed)
Physical Therapy Treatment Patient Details Name: Renee [name] MRN: [id number] DOB: 08/29/1950 Today's Date: 08/27/2017    History of Present Illness Renee [name] is a 67 y.o. F is s/p L TKR. PMH includes depression, edema, arthritis, and collagenous collitis.     PT Comments    Pt with increased pain but agreeable to supine exercises and gait training.  Plan for progression of gait and exercises this pm.    Follow Up Recommendations  Follow surgeon's recommendation for DC plan and follow-up therapies     Equipment Recommendations  3in1 (PT);Rolling walker with 5" wheels    Recommendations for Other Services       Precautions / Restrictions Precautions Precautions: Knee;Fall Precaution Booklet Issued: Yes (comment) Precaution Comments: Pt educated on supine HEP and precautions Restrictions Weight Bearing Restrictions: Yes LLE Weight Bearing: Weight bearing as tolerated    Mobility  Bed Mobility Overal bed mobility: Needs Assistance Bed Mobility: Sit to Supine       Sit to supine: Min assist   General bed mobility comments: Pt in recliner on arrival.   Transfers Overall transfer level: Needs assistance Equipment used: Rolling walker (2 wheeled) Transfers: Sit to/from Stand Sit to Stand: Min assist         General transfer comment: Cues for hand placement to and from seated surface. Pt slow and guarded due to pain.  Poor knee flexion returning to seated surface.    Ambulation/Gait Ambulation/Gait assistance: Min guard Gait Distance (Feet): 60 Feet Assistive device: Rolling walker (2 wheeled) Gait Pattern/deviations: Step-to pattern;Decreased step length - right;Decreased stance time - left;Decreased stride length;Decreased weight shift to left;Antalgic;Scissoring;Step-through pattern Gait velocity: Decreased   General Gait Details: Pt with poor tolerance to weight shifting to L side.  Cues for weight shifting to improve gait symmetry as gait progressed.      Stairs             Wheelchair Mobility    Modified Rankin (Stroke Patients Only)       Balance Overall balance assessment: Needs assistance Sitting-balance support: No upper extremity supported;Feet supported Sitting balance-Leahy Scale: Good     Standing balance support: Bilateral upper extremity supported Standing balance-Leahy Scale: Poor Standing balance comment: Pt reliant on RW for balance. Pt had no visible LOB while performing static standing.                            Cognition Arousal/Alertness: Awake/alert Behavior During Therapy: WFL for tasks assessed/performed Overall Cognitive Status: Within Functional Limits for tasks assessed                                        Exercises Total Joint Exercises Ankle Circles/Pumps: AROM;Both;20 reps;Supine Quad Sets: AROM;Left;10 reps;Supine Towel Squeeze: AROM;Both;10 reps;Supine Heel Slides: AROM;Left;10 reps;Supine Goniometric ROM: 74 degrees flexion in R knee.      General Comments        Pertinent Vitals/Pain Pain Assessment: 0-10 Pain Score: 8  Pain Location: L Knee Pain Descriptors / Indicators: Grimacing;Guarding;Sore Pain Intervention(s): Monitored during session;Repositioned;Ice applied;Patient requesting pain meds-RN notified    Home Living Family/patient expects to be discharged to:: Private residence Living Arrangements: Alone Available Help at Discharge: Family;Available PRN/intermittently Type of Home: House Home Access: Stairs to enter Entrance Stairs-Rails: Can reach both Home Layout: One level Home Equipment: None Additional Comments: Pt reports her son  will be able to take her home after surgery. Pt reports that family will be able to visit as she needs, and so will friends. Pt reports no one can be there 24/7    Prior Function Level of Independence: Independent      Comments: Pt reports she had no difficulties with ADLs and was able to work at a  [employer]   PT Goals (current goals can now be found in the care plan section) Acute Rehab PT Goals Patient Stated Goal: To decrease pain Potential to Achieve Goals: Good Progress towards PT goals: Progressing toward goals    Frequency    7X/week      PT Plan Current plan remains appropriate    Co-evaluation              AM-PAC PT "6 Clicks" Daily Activity  Outcome Measure  Difficulty turning over in bed (including adjusting bedclothes, sheets and blankets)?: A Little Difficulty moving from lying on back to sitting on the side of the bed? : A Little Difficulty sitting down on and standing up from a chair with arms (e.g., wheelchair, bedside commode, etc,.)?: A Little Help needed moving to and from a bed to chair (including a wheelchair)?: A Little Help needed walking in hospital room?: A Little Help needed climbing 3-5 steps with a railing? : A Little 6 Click Score: 18    End of Session Equipment Utilized During Treatment: Gait belt Activity Tolerance: Patient tolerated treatment well Patient left: in chair;with call bell/phone within reach;with chair alarm set Nurse Communication: Mobility status PT Visit Diagnosis: Other abnormalities of gait and mobility (R26.89);Muscle weakness (generalized) (M62.81);Difficulty in walking, not elsewhere classified (R26.2);Pain Pain - Right/Left: Left Pain - part of body: Knee     Time: 1153-1222 PT Time Calculation (min) (ACUTE ONLY): 29 min  Charges:  $Gait Training: 8-22 mins $Therapeutic Exercise: 8-22 mins                     Joycelyn Rua, PTA pager [phone]    Florestine Avers 08/27/2017, 2:56 PM

## 2017-08-27 NOTE — Discharge Instructions (Signed)

## 2017-08-28 MED ORDER — METHOCARBAMOL 500 MG PO TABS: 500.0000 mg | ORAL_TABLET | Freq: Four times a day (QID) | ORAL | 1 refills | Status: DC | PRN

## 2017-08-28 MED ORDER — ASPIRIN 81 MG PO CHEW: 81.0000 mg | CHEWABLE_TABLET | Freq: Two times a day (BID) | ORAL | 0 refills | Status: AC

## 2017-08-28 MED ORDER — OXYCODONE-ACETAMINOPHEN 5-325 MG PO TABS: 1.0000 | ORAL_TABLET | ORAL | 0 refills | Status: DC | PRN

## 2017-08-28 NOTE — Care Management Note (Signed)
Case Management Note  Patient Details  Name: Renee [name] MRN: [id number] Date of Birth: 07/28/1950  Subjective/Objective: 67 yr old female s/p left total knee arthroplasty.                   Action/Plan:  Patient was preoperatively setup with [facility], no changes. Will have family support at discharge.    Expected Discharge Date:  08/28/17               Expected Discharge Plan:  Home w Home Health Services  In-House Referral:  NA  Discharge planning Services  CM Consult  Post Acute Care Choice:  Durable Medical Equipment, Home Health Choice offered to:  Patient  DME Arranged:  3-N-1, Walker rolling DME Agency:  [facility]  HH Arranged:  PT HH [facility]  [facility]  Status of Service:  Completed, signed off  If discussed at [employer] Meetings, dates discussed:    Additional Comments:  Durenda GuthrieBrady, Vick Filter Naomi, RN 08/28/2017, 12:28 PM

## 2017-08-28 NOTE — Discharge Summary (Signed)
Patient ID: Renee [name] MRN: [id number] DOB/AGE: [birth year] 67 y.o.  Admit date: 08/26/2017 Discharge date: 08/28/2017  Admission Diagnoses:  Principal Problem:   Unilateral primary osteoarthritis, left knee Active Problems:   Status post total left knee replacement   Discharge Diagnoses:  Same  Past Medical History:  Diagnosis Date  . Anemia   . Arthritis   . Collagenous colitis   . Depression   . Edema     Surgeries: Procedure(s): LEFT TOTAL KNEE ARTHROPLASTY on 08/26/2017   Consultants:   Discharged Condition: Improved  Hospital Course: Renee [name] is an 67 y.o. female who was admitted 08/26/2017 for operative treatment ofUnilateral primary osteoarthritis, left knee. Patient has severe unremitting pain that affects sleep, daily activities, and work/hobbies. After pre-op clearance the patient was taken to the operating room on 08/26/2017 and underwent  Procedure(s): LEFT TOTAL KNEE ARTHROPLASTY.    Patient was given perioperative antibiotics:  Anti-infectives (From admission, onward)   Start     Dose/Rate Route Frequency Ordered Stop   08/26/17 1615  ceFAZolin (ANCEF) IVPB 1 g/50 mL premix     1 g 100 mL/hr over 30 Minutes Intravenous Every 6 hours 08/26/17 1531 08/26/17 2218   08/26/17 0915  ceFAZolin (ANCEF) IVPB 2g/100 mL premix     2 g 200 mL/hr over 30 Minutes Intravenous On call to O.R. 08/26/17 2130 08/26/17 0959   08/26/17 0738  ceFAZolin (ANCEF) 2-4 GM/100ML-% IVPB    Note to Pharmacy:  Posey Pronto   : cabinet override      08/26/17 0738 08/26/17 0949       Patient was given sequential compression devices, early ambulation, and chemoprophylaxis to prevent DVT.  Patient benefited maximally from hospital stay and there were no complications.    Recent vital signs:  Patient Vitals for the past 24 hrs:  BP Temp Temp src Pulse Resp SpO2  08/28/17 0440 132/84 99.2 F (37.3 C) Oral 77 16 99 %  08/27/17 1953 (!) 154/82 - - 72 - 100 %  08/27/17 1952 (!)  163/85 98.2 F (36.8 C) Oral 83 16 99 %  08/27/17 1400 (!) 144/82 98.2 F (36.8 C) Oral 64 17 100 %     Recent laboratory studies:  Recent Labs    08/27/17 0447  WBC 9.7  HGB 11.3*  HCT 33.4*  PLT 204  NA 135  K 4.0  CL 98  CO2 30  BUN 13  CREATININE 0.47  GLUCOSE 85  CALCIUM 9.0     Discharge Medications:   Allergies as of 08/28/2017      Reactions   Psyllium Anaphylaxis      Medication List    TAKE these medications   ADULT GUMMY PO Take 1 tablet by mouth daily.   aspirin 81 MG chewable tablet Chew 1 tablet (81 mg total) by mouth 2 (two) times daily.   buPROPion 150 MG 24 hr tablet Commonly known as:  WELLBUTRIN XL Take 300 mg by mouth daily.   citalopram 40 MG tablet Commonly known as:  CELEXA Take 40 mg by mouth daily.   cyanocobalamin 1000 MCG/ML injection Commonly known as:  (VITAMIN B-12) Inject 1,000 mcg into the muscle every 30 (thirty) days.   hydrochlorothiazide 25 MG tablet Commonly known as:  HYDRODIURIL Take 25 mg by mouth daily.   methocarbamol 500 MG tablet Commonly known as:  ROBAXIN Take 1 tablet (500 mg total) by mouth every 6 (six) hours as needed for muscle spasms.   oxyCODONE-acetaminophen 5-325  MG tablet Commonly known as:  PERCOCET Take 1 tablet by mouth every 4 (four) hours as needed for severe pain.   Potassium 99 MG Tabs Take 99 mg by mouth daily.   SLOW RELEASE IRON 45 MG Tbcr Generic drug:  Ferrous Sulfate Dried Take 45 mg by mouth daily.   Syringe/Needle (Disp) 27G X 5/8" 1 ML Misc Use once monthly for subcutaneous  B12 injections   traZODone 50 MG tablet Commonly known as:  DESYREL TAKE 2 TABLETS BY MOUTH EVERY NIGHT AT BEDTIME            Durable Medical Equipment  (From admission, onward)        Start     Ordered   08/26/17 1532  DME Walker rolling  Once    Question:  Patient needs a walker to treat with the following condition  Answer:  Status post total left knee replacement   08/26/17 1531    08/26/17 1532  DME 3 n 1  Once     08/26/17 1531      Diagnostic Studies: Dg Knee Left Port  Result Date: 08/26/2017 CLINICAL DATA:  Status post left total knee replacement. EXAM: PORTABLE LEFT KNEE - 1-2 VIEW COMPARISON:  None. FINDINGS: The femoral and tibial components appear to be well situated. Expected postoperative changes are noted in the soft tissues anteriorly. No fracture or dislocation is noted. IMPRESSION: Status post left total knee arthroplasty. Electronically Signed   By: Lupita RaiderJames  Green Jr, M.D.   On: 08/26/2017 12:57    Disposition: Discharge disposition: 01-Home or Self Care         Follow-up Information    Kathryne HitchBlackman, Christopher Y, MD Follow up in 2 week(s).   Specialty:  Orthopedic Surgery Contact information: [address] [place] [address] [phone]        [facility] Follow up.   Specialty:  Home Health Services Why:  A representative from [facility] will contact you to arrange start date and time for your therapy. Contact information: [address] [place] [address] [phone]            Signed: Richardean CanalGILBERT CLARK 08/28/2017, 9:35 AM

## 2017-08-28 NOTE — Progress Notes (Signed)
Physical Therapy Treatment Patient Details Name: Renee LinesDonna M [name] MRN: [id number] DOB: 07/30/1950 Today's Date: 08/28/2017    History of Present Illness Renee [name] is a 67 y.o. F is s/p L TKR. PMH includes depression, edema, arthritis, and collagenous collitis.     PT Comments    Pt with improved pain control.  She performed increased gait and climbed 6 stairs.  Pt is ready to d/c home from a mobility stand point.  Pt to return home today.     Follow Up Recommendations  Follow surgeon's recommendation for DC plan and follow-up therapies     Equipment Recommendations  3in1 (PT);Rolling walker with 5" wheels    Recommendations for Other Services       Precautions / Restrictions Precautions Precautions: Knee;Fall Precaution Booklet Issued: Yes (comment) Precaution Comments: Pt educated on supine HEP and precautions Restrictions Weight Bearing Restrictions: Yes LLE Weight Bearing: Weight bearing as tolerated    Mobility  Bed Mobility Overal bed mobility: Modified Independent Bed Mobility: Sit to Supine;Supine to Sit     Supine to sit: Modified independent (Device/Increase time) Sit to supine: Modified independent (Device/Increase time)   General bed mobility comments: Pt performed without assistance she remains slow and guarded due to pain but pain is much improved from previous session.    Transfers Overall transfer level: Needs assistance Equipment used: Rolling walker (2 wheeled) Transfers: Sit to/from Stand Sit to Stand: Supervision         General transfer comment: Cues for hand placement to and from seated surface.    Ambulation/Gait Ambulation/Gait assistance: Min guard;Supervision Gait Distance (Feet): 250 Feet Assistive device: Rolling walker (2 wheeled) Gait Pattern/deviations: Step-to pattern;Decreased step length - right;Decreased stride length;Decreased weight shift to left;Antalgic;Step-through pattern     General Gait Details: Cues for upper trunk  control and forward gaze.  Pt with cues for gt symmetry and progression to step through pattern.     Stairs Stairs: Yes Stairs assistance: Supervision Stair Management: Two rails;Forwards Number of Stairs: 6 General stair comments: Cues for sequencing and hand placement on railings.     Wheelchair Mobility    Modified Rankin (Stroke Patients Only)       Balance Overall balance assessment: Needs assistance   Sitting balance-Leahy Scale: Good       Standing balance-Leahy Scale: Poor                              Cognition Arousal/Alertness: Awake/alert Behavior During Therapy: WFL for tasks assessed/performed Overall Cognitive Status: Within Functional Limits for tasks assessed                                        Exercises Total Joint Exercises Ankle Circles/Pumps: AROM;Both;20 reps;Supine Quad Sets: AROM;Left;10 reps;Supine Heel Slides: AROM;Left;10 reps;Supine Hip ABduction/ADduction: AROM;Left;10 reps;Supine Straight Leg Raises: AROM;Left;10 reps;Supine Goniometric ROM: 84 degree flexion in R knee.      General Comments        Pertinent Vitals/Pain Pain Assessment: 0-10 Pain Score: 5  Pain Location: L Knee Pain Descriptors / Indicators: Grimacing;Guarding;Sore Pain Intervention(s): Monitored during session;Ice applied;Repositioned    Home Living                      Prior Function            PT Goals (current goals can  now be found in the care plan section) Acute Rehab PT Goals Patient Stated Goal: To decrease pain Potential to Achieve Goals: Good Progress towards PT goals: Progressing toward goals    Frequency    7X/week      PT Plan Current plan remains appropriate    Co-evaluation              AM-PAC PT "6 Clicks" Daily Activity  Outcome Measure  Difficulty turning over in bed (including adjusting bedclothes, sheets and blankets)?: None Difficulty moving from lying on back to sitting on  the side of the bed? : None Difficulty sitting down on and standing up from a chair with arms (e.g., wheelchair, bedside commode, etc,.)?: A Little Help needed moving to and from a bed to chair (including a wheelchair)?: A Little Help needed walking in hospital room?: A Little Help needed climbing 3-5 steps with a railing? : A Little 6 Click Score: 20    End of Session Equipment Utilized During Treatment: Gait belt Activity Tolerance: Patient tolerated treatment well Patient left: with call bell/phone within reach;in bed;in CPM Nurse Communication: Mobility status PT Visit Diagnosis: Other abnormalities of gait and mobility (R26.89);Muscle weakness (generalized) (M62.81);Difficulty in walking, not elsewhere classified (R26.2);Pain Pain - Right/Left: Left Pain - part of body: Knee     Time: 1101-1118 PT Time Calculation (min) (ACUTE ONLY): 17 min  Charges:  $Gait Training: 8-22 mins                     Joycelyn Rua, PTA pager [phone]    Florestine Avers 08/28/2017, 11:29 AM

## 2017-08-28 NOTE — Progress Notes (Signed)
All education completed as detailed below with pt functioning at a supervision to modified independent level in ADL. No further OT needs.   08/28/17 1140  OT Visit Information  Last OT Received On 08/28/17  Assistance Needed +1  History of Present Illness Renee [name] is a 67 y.o. F is s/p L TKR. PMH includes depression, edema, arthritis, and collagenous collitis.   Precautions  Precautions Knee;Fall  Pain Assessment  Pain Assessment Faces  Faces Pain Scale 6  Pain Location L Knee  Pain Descriptors / Indicators Grimacing;Guarding;Sore  Pain Intervention(s) Monitored during session;Premedicated before session;Ice applied;Repositioned  Cognition  Arousal/Alertness Awake/alert  Behavior During Therapy WFL for tasks assessed/performed  Overall Cognitive Status Within Functional Limits for tasks assessed  ADL  Overall ADL's  Needs assistance/impaired  Lower Body Bathing Modified independent;Sit to/from stand  Lower Body Dressing Modified independent;Sit to/from Arboriculturist;Ambulation;RW  Toileting- Clothing Manipulation and Hygiene Modified independent;Sit to/from stand  Tub/ Engineer, maintenance;Ambulation;Shower Development worker, community Details (indicate cue type and reason) educated in 2 techniques for tub transfer onto shower seat with back  Functional mobility during ADLs Supervision/safety;Rolling walker  General ADL Comments pt able to reach her L foot without difficulty, educated in safe footwear, to keep her phone with her and how to transport items safely with RW  Bed Mobility  Overal bed mobility Modified Independent  Balance  Sitting balance-Leahy Scale Good  Standing balance-Leahy Scale Fair  Standing balance comment statically  Restrictions  Weight Bearing Restrictions Yes  LLE Weight Bearing WBAT  Transfers  Overall transfer level Needs assistance  Equipment used Rolling walker (2 wheeled)  Transfers Sit  to/from Stand  Sit to Stand Supervision  OT - End of [place] During Treatment Rolling walker  Activity Tolerance Patient tolerated treatment well  Patient left in bed;with call bell/phone within reach  OT Assessment/Plan  OT Plan All goals met and education completed, patient discharged from OT services  OT Visit Diagnosis Unsteadiness on feet (R26.81);Other abnormalities of gait and mobility (R26.89)  OT Frequency (ACUTE ONLY) Min 2X/week  Follow Up Recommendations No OT follow up;Supervision - Intermittent  OT Equipment None recommended by OT  AM-PAC OT "6 Clicks" Daily Activity Outcome Measure  Help from another person eating meals? 4  Help from another person taking care of personal grooming? 4  Help from another person toileting, which includes using toliet, bedpan, or urinal? 4  Help from another person bathing (including washing, rinsing, drying)? 3  Help from another person to put on and taking off regular upper body clothing? 4  Help from another person to put on and taking off regular lower body clothing? 3  6 Click Score 22  ADL G Code Conversion CJ  OT Goal Progression  Progress towards OT goals Progressing toward goals  Acute Rehab OT Goals  Patient Stated Goal To decrease pain  Time For Goal Achievement 09/03/17  OT Time Calculation  OT Start Time (ACUTE ONLY) 1124  OT Stop Time (ACUTE ONLY) 1137  OT Time Calculation (min) 13 min  OT General Charges  $OT Visit 1 Visit  OT Treatments  $Self Care/Home Management  8-22 mins  08/28/2017 Nestor Lewandowsky, OTR/L Pager: [phone]

## 2017-08-28 NOTE — Progress Notes (Signed)
Written and verbal discharge instructions provided to the patient.  The patient verbalizes understanding instructions and the follow up in 2 weeks.  Patient will be discharged to home via wheelchair.

## 2017-08-28 NOTE — Progress Notes (Signed)
Subjective: 2 Days Post-Op Procedure(s) (LRB): LEFT TOTAL KNEE ARTHROPLASTY (Left) Patient reports pain as moderate.  No complaints otherwise wanting to go home after PT today.   Objective: Vital signs in last 24 hours: Temp:  [98.2 F (36.8 C)-99.2 F (37.3 C)] 99.2 F (37.3 C) (08/01 0440) Pulse Rate:  [64-83] 77 (08/01 0440) Resp:  [16-17] 16 (08/01 0440) BP: (132-163)/(82-85) 132/84 (08/01 0440) SpO2:  [99 %-100 %] 99 % (08/01 0440)  Intake/Output from previous day: 07/31 0701 - 08/01 0700 In: 477 [P.O.:477] Out: -  Intake/Output this shift: No intake/output data recorded.  Recent Labs    08/27/17 0447  HGB 11.3*   Recent Labs    08/27/17 0447  WBC 9.7  RBC 3.51*  HCT 33.4*  PLT 204   Recent Labs    08/27/17 0447  NA 135  K 4.0  CL 98  CO2 30  BUN 13  CREATININE 0.47  GLUCOSE 85  CALCIUM 9.0   No results for input(s): LABPT, INR in the last 72 hours.  Intact pulses distally Incision: dressing C/D/I Compartment soft    Assessment/Plan: 2 Days Post-Op Procedure(s) (LRB): LEFT TOTAL KNEE ARTHROPLASTY (Left) Up with therapy Discharge home with home health    Richardean CanalGILBERT CLARK 08/28/2017, 9:25 AM

## 2017-08-29 ENCOUNTER — Other Ambulatory Visit (INDEPENDENT_AMBULATORY_CARE_PROVIDER_SITE_OTHER): Payer: Self-pay

## 2017-08-29 ENCOUNTER — Other Ambulatory Visit (INDEPENDENT_AMBULATORY_CARE_PROVIDER_SITE_OTHER): Payer: Self-pay | Admitting: Physician Assistant

## 2017-08-29 DIAGNOSIS — Z9181 History of falling: Secondary | ICD-10-CM | POA: Diagnosis not present

## 2017-08-29 DIAGNOSIS — F329 Major depressive disorder, single episode, unspecified: Secondary | ICD-10-CM | POA: Diagnosis not present

## 2017-08-29 DIAGNOSIS — Z7982 Long term (current) use of aspirin: Secondary | ICD-10-CM | POA: Diagnosis not present

## 2017-08-29 DIAGNOSIS — Z96652 Presence of left artificial knee joint: Secondary | ICD-10-CM | POA: Diagnosis not present

## 2017-08-29 DIAGNOSIS — F17210 Nicotine dependence, cigarettes, uncomplicated: Secondary | ICD-10-CM | POA: Diagnosis not present

## 2017-08-29 DIAGNOSIS — Z471 Aftercare following joint replacement surgery: Secondary | ICD-10-CM | POA: Diagnosis not present

## 2017-08-29 MED ORDER — TIZANIDINE HCL 4 MG PO TABS: 4.0000 mg | ORAL_TABLET | Freq: Four times a day (QID) | ORAL | 0 refills | Status: DC | PRN

## 2017-09-01 ENCOUNTER — Telehealth (INDEPENDENT_AMBULATORY_CARE_PROVIDER_SITE_OTHER): Payer: Self-pay | Admitting: Orthopaedic Surgery

## 2017-09-01 DIAGNOSIS — Z96652 Presence of left artificial knee joint: Secondary | ICD-10-CM | POA: Diagnosis not present

## 2017-09-01 DIAGNOSIS — F17210 Nicotine dependence, cigarettes, uncomplicated: Secondary | ICD-10-CM | POA: Diagnosis not present

## 2017-09-01 DIAGNOSIS — F329 Major depressive disorder, single episode, unspecified: Secondary | ICD-10-CM | POA: Diagnosis not present

## 2017-09-01 DIAGNOSIS — Z471 Aftercare following joint replacement surgery: Secondary | ICD-10-CM | POA: Diagnosis not present

## 2017-09-01 DIAGNOSIS — Z9181 History of falling: Secondary | ICD-10-CM | POA: Diagnosis not present

## 2017-09-01 DIAGNOSIS — Z7982 Long term (current) use of aspirin: Secondary | ICD-10-CM | POA: Diagnosis not present

## 2017-09-01 NOTE — Telephone Encounter (Signed)
Berline LopesGracie, PT with [facility] is requesting verbal orders for this patient  1 x for 1 week 3 x for 1 week 2 x for 1 week  CB # [phone]

## 2017-09-01 NOTE — Telephone Encounter (Signed)
Verbal order given on VM 

## 2017-09-04 ENCOUNTER — Other Ambulatory Visit (INDEPENDENT_AMBULATORY_CARE_PROVIDER_SITE_OTHER): Payer: Self-pay | Admitting: Physician Assistant

## 2017-09-04 ENCOUNTER — Telehealth (INDEPENDENT_AMBULATORY_CARE_PROVIDER_SITE_OTHER): Payer: Self-pay | Admitting: Orthopaedic Surgery

## 2017-09-04 MED ORDER — OXYCODONE-ACETAMINOPHEN 5-325 MG PO TABS: 1.0000 | ORAL_TABLET | ORAL | 0 refills | Status: AC | PRN

## 2017-09-04 NOTE — Telephone Encounter (Signed)
Please advise 

## 2017-09-04 NOTE — Telephone Encounter (Signed)
On your desk

## 2017-09-04 NOTE — Telephone Encounter (Signed)
Patient called to request an RX refill on her Oxycodone.  CB#[phone].  Thank you

## 2017-09-05 NOTE — Telephone Encounter (Signed)
IC patient and advised Rx ready to pickup 

## 2017-09-09 ENCOUNTER — Encounter (INDEPENDENT_AMBULATORY_CARE_PROVIDER_SITE_OTHER): Payer: Self-pay | Admitting: Physician Assistant

## 2017-09-09 ENCOUNTER — Other Ambulatory Visit (INDEPENDENT_AMBULATORY_CARE_PROVIDER_SITE_OTHER): Payer: Self-pay

## 2017-09-09 ENCOUNTER — Ambulatory Visit (INDEPENDENT_AMBULATORY_CARE_PROVIDER_SITE_OTHER): Payer: PPO | Admitting: Physician Assistant

## 2017-09-09 DIAGNOSIS — Z96652 Presence of left artificial knee joint: Secondary | ICD-10-CM

## 2017-09-09 NOTE — Progress Notes (Signed)
HPI: [name] comes in today 2 weeks status post left total knee arthroplasty.  She is overall doing well states her range of motion strength improving.  She is ambulating without any assistive device.  She is on aspirin 81 mg twice daily.  Is taking oxycodone for pain.  She is been working with home therapy on range of motion and strength.  No calf pain chest pain shortness of breath fevers or chills.  Physical exam: General well-developed well-nourished female no acute distress made affect appropriate.  Psych alert and oriented x3 Left knee she has full extension and flexion to 110 degrees.  Surgical incisions healing well subcu stitch no signs of infection dehiscence.  Positive for edema no erythema about the knee.  Left calf supple nontender.  Impression: 2 weeks status post left total knee arthroplasty  Plan: She will continue to work on range of motion strengthening the knee.  We will send her to outpatient physical therapy to work on gait balance and strength.  She will follow with us in 1 month sooner if there is any questions or concerns.  Scar tissue mobilization encouraged.

## 2017-09-10 DIAGNOSIS — Z96652 Presence of left artificial knee joint: Secondary | ICD-10-CM | POA: Diagnosis not present

## 2017-09-10 DIAGNOSIS — Z9181 History of falling: Secondary | ICD-10-CM | POA: Diagnosis not present

## 2017-09-10 DIAGNOSIS — Z7982 Long term (current) use of aspirin: Secondary | ICD-10-CM | POA: Diagnosis not present

## 2017-09-10 DIAGNOSIS — Z471 Aftercare following joint replacement surgery: Secondary | ICD-10-CM | POA: Diagnosis not present

## 2017-09-10 DIAGNOSIS — F17210 Nicotine dependence, cigarettes, uncomplicated: Secondary | ICD-10-CM | POA: Diagnosis not present

## 2017-09-10 DIAGNOSIS — F329 Major depressive disorder, single episode, unspecified: Secondary | ICD-10-CM | POA: Diagnosis not present

## 2017-09-22 ENCOUNTER — Other Ambulatory Visit: Payer: Self-pay

## 2017-09-22 ENCOUNTER — Ambulatory Visit: Payer: PPO | Attending: Orthopaedic Surgery | Admitting: Physical Therapy

## 2017-09-22 DIAGNOSIS — M25662 Stiffness of left knee, not elsewhere classified: Secondary | ICD-10-CM | POA: Diagnosis not present

## 2017-09-22 DIAGNOSIS — M6281 Muscle weakness (generalized): Secondary | ICD-10-CM | POA: Diagnosis not present

## 2017-09-22 DIAGNOSIS — R600 Localized edema: Secondary | ICD-10-CM | POA: Diagnosis not present

## 2017-09-22 NOTE — Patient Instructions (Signed)
Wall-Sit    With back pressed against a wall, bend knees and slide buttocks down until knees are about 90 or more. Stay in this position for _2-3seconds. Exhale while straightening legs. Repeat _10-30__ times. Do _2__ times per day.   Quadriceps (Prone)   On stomach with sheet around ankles, knees together, hips down, pull heels toward bottom. Keep hips flat. Hold __60__ seconds. Repeat _3__ times. Do __3__ sessions per day. CAUTION: Stretch should be gentle, steady and slow.   HIP: Flexors - Supine   Lie on edge of surface. Place leg off the surface, allow knee to bend. Bring other knee toward chest. Hold _60__ seconds. _3__ reps per set, _2-3__ times per day, Rest lowered foot on stool if needed.    Hip Flexor Stretch   STAND with left knee on surface. Interlace fingers on top of right knee. Keeping trunk straight and contracting abdominal muscles, slowly shift weight forward. Continue breathing normally and hold position for 30 -60 seconds. Repeat on other leg. Alternate sides _3__ times. Do _2-3__ times per day.   Solon PalmJulie Eyvette Cordon, PT 09/22/17 9:24 AM [facility] [address] [place], [place], [address] Phone: [phone]   Fax:  [phone]

## 2017-09-22 NOTE — Therapy (Signed)
[facility] [address] [place], [place], [address] Phone: [phone]   Fax:  [phone]  Physical Therapy Evaluation  Patient Details  Name: Renee LinesDonna M Aspinall MRN: [id number] Date of Birth: 08/26/1950 Referring Provider: Richardean CanalGilbert Clark   Encounter Date: 09/22/2017  PT End of Session - 09/22/17 0846    Visit Number  1    Number of Visits  8    Date for PT Re-Evaluation  10/20/17    PT Start Time  0846    PT Stop Time  0924    PT Time Calculation (min)  38 min    Activity Tolerance  Patient tolerated treatment well    Behavior During Therapy  [facility] for tasks assessed/performed       Past Medical History:  Diagnosis Date  . Anemia   . Arthritis   . Collagenous colitis   . Depression   . Edema     Past Surgical History:  Procedure Laterality Date  . CARDIAC CATHETERIZATION  2017  . CESAREAN SECTION    . GASTRIC BYPASS    . TOTAL KNEE ARTHROPLASTY Left 08/26/2017   Procedure: LEFT TOTAL KNEE ARTHROPLASTY;  Surgeon: Kathryne HitchBlackman, Christopher Y, MD;  [facility];  Service: Orthopedics;  Laterality: Left;    There were no vitals filed for this visit.   Subjective Assessment - 09/22/17 0854    Subjective  Patient presents s/p L TKR on 08/26/17. She amb without AD    Pertinent History  TKR 08/26/17; arthritis, depresssion    Patient Stated Goals  to improve movement and strength    Currently in Pain?  Yes    Pain Score  3     Pain Location  Knee    Pain Orientation  Left    Pain Descriptors / Indicators  Tightness;Spasm    Pain Type  Surgical pain    Pain Radiating Towards  gets spasms in quads and calf intermittently    Pain Onset  1 to 4 weeks ago    Pain Frequency  Intermittent    Aggravating Factors   hanging foot down for prolonged periods    Pain Relieving Factors  ice and elevation    Effect of Pain on Daily Activities  would like to get back to work 7-8 hour shift standing at [employer]          [facility] PT Assessment - 09/22/17 0001      Assessment   Medical Diagnosis  s/p L TKR    Referring Provider  Richardean CanalGilbert Clark    Onset Date/Surgical Date  08/26/17    Next MD Visit  10/10/17    Prior Therapy  [facility] x 6 visits      Precautions   Precautions  None      Restrictions   Weight Bearing Restrictions  No      Balance Screen   Has the patient fallen in the past 6 months  No    Has the patient had a decrease in activity level because of a fear of falling?   No    Is the patient reluctant to leave their home because of a fear of falling?   No      Home Environment   Living Environment  Private residence    Living Arrangements  Alone    Type of Home  House    Home Access  Stairs to enter    Entrance Stairs-Number of Steps  5  Entrance Stairs-Rails  Cannot reach both    Home Layout  One level      Prior Function   Level of Independence  Independent    Vocation  Part time employment    Vocation Requirements  standing 7-8 hours      Observation/Other Assessments   Focus on Therapeutic Outcomes (FOTO)   36%  limited      Functional Tests   Functional tests  Squat;Single leg stance      Squat   Comments  deviates to RLE      Single Leg Stance   Comments  10 sec on LLE      Posture/Postural Control   Posture Comments  R ankle valgus/pronation      ROM / Strength   AROM / PROM / Strength  AROM;PROM;Strength      AROM   AROM Assessment Site  Knee    Right/Left Knee  Left    Left Knee Extension  0    Left Knee Flexion  112      PROM   PROM Assessment Site  Knee    Right/Left Knee  Left    Left Knee Extension  0    Left Knee Flexion  125      Strength   Overall Strength Comments  5/5 in L hip and knee      Flexibility   Soft Tissue Assessment /Muscle Length  yes    Quadriceps  L prone knee flex to 90 deg      Palpation   Patella mobility  WNL    Palpation comment  unremarkable      Ambulation/Gait   Gait Comments  R ankle pronation/calc valgus                 Objective measurements completed on examination: See above findings.              PT Education - 09/22/17 1208    Education Details  HEP    Person(s) Educated  Patient    Methods  Explanation;Demonstration;Handout    Comprehension  Returned demonstration;Verbalized understanding          PT Long Term Goals - 09/22/17 0938      PT LONG TERM GOAL #1   Title  Patient to improve prone knee flexion to 100 degrees or more.    Time  4    Period  Weeks    Status  New      PT LONG TERM GOAL #2   Title  Patient able to perform lift and carry similar to work function without difficulty.    Time  4    Period  Weeks    Status  New      PT LONG TERM GOAL #3   Title  Patient able to perform ADLs with 2/10 pain or less.    Time  4    Period  Weeks    Status  New      PT LONG TERM GOAL #4   Title  Patient able to perform functional squat with equal weightbearing.    Time  4    Period  Weeks    Status  New             Plan - 09/22/17 0926    Clinical Impression Statement  Patient presents s/p L TKR on 08/26/17. Her main deficits are decreased knee flexion, edema, muscle spasms and functional strength. She amb without AD and demonstrates good SLS. She  has intermittent pain mainly with bending and needs to be able to lift 50# to RTW.     History and Personal Factors relevant to plan of care:  arthritis, depression    Clinical Presentation  Stable    Clinical Decision Making  Low    Rehab Potential  Excellent    PT Frequency  2x / week    PT Duration  4 weeks    PT Treatment/Interventions  ADLs/Self Care Home Management;Cryotherapy;Microbiologist;Therapeutic exercise;Therapeutic activities;Patient/family education;Manual techniques;Taping;Vasopneumatic Device    PT Next Visit Plan  L knee flexion (prone), strength, functional squats and lifting for RTW    PT Home Exercise Plan  wall slide, prone quad stretch with  strap, supine hip flexor stretch    Consulted and Agree with Plan of Care  Patient       Patient will benefit from skilled therapeutic intervention in order to improve the following deficits and impairments:  Decreased range of motion, Pain, Increased muscle spasms, Impaired flexibility, Decreased strength, Increased edema  Visit Diagnosis: Stiffness of left knee, not elsewhere classified - Plan: PT plan of care cert/re-cert  Localized edema - Plan: PT plan of care cert/re-cert  Muscle weakness (generalized) - Plan: PT plan of care cert/re-cert     Problem List Patient Active Problem List   Diagnosis Date Noted  . Status post total left knee replacement 08/26/2017  . Unilateral primary osteoarthritis, left knee 07/16/2017  . Collagenous colitis 05/24/2016    Srinidhi Landers PT 09/22/2017, 12:12 PM  [facility] [address] [place], [place], [address] Phone: [phone]   Fax:  [phone]  Name: HABIBA TRELOAR MRN: [id number] Date of Birth: [birth year]

## 2017-09-30 ENCOUNTER — Ambulatory Visit: Payer: PPO | Attending: Orthopaedic Surgery | Admitting: Physical Therapy

## 2017-09-30 DIAGNOSIS — R600 Localized edema: Secondary | ICD-10-CM | POA: Diagnosis not present

## 2017-09-30 DIAGNOSIS — M6281 Muscle weakness (generalized): Secondary | ICD-10-CM | POA: Diagnosis not present

## 2017-09-30 DIAGNOSIS — M25662 Stiffness of left knee, not elsewhere classified: Secondary | ICD-10-CM | POA: Insufficient documentation

## 2017-09-30 NOTE — Therapy (Signed)
[place] [place], [place], [address] Phone: [phone]   Fax:  [phone]  Physical Therapy Treatment  Patient Details  Name: Renee [name] MRN: [id number] Date of Birth: 06/02/1950 Referring Provider: Erskine Emery   Encounter Date: 09/30/2017  PT End of Session - 09/30/17 0758    Visit Number  2    Number of Visits  8    Date for PT Re-Evaluation  10/20/17    PT Start Time  0758    PT Stop Time  0858    PT Time Calculation (min)  60 min    Activity Tolerance  Patient tolerated treatment well    Behavior During Therapy  [facility] for tasks assessed/performed       Past Medical History:  Diagnosis Date  . Anemia   . Arthritis   . Collagenous colitis   . Depression   . Edema     Past Surgical History:  Procedure Laterality Date  . CARDIAC CATHETERIZATION  2017  . CESAREAN SECTION    . GASTRIC BYPASS    . TOTAL KNEE ARTHROPLASTY Left 08/26/2017   Procedure: LEFT TOTAL KNEE ARTHROPLASTY;  Surgeon: Mcarthur Rossetti, MD;  Location: [facility];  Service: Orthopedics;  Laterality: Left;    There were no vitals filed for this visit.  Subjective Assessment - 09/30/17 0801    Subjective  The knee feels great, but my back has been hurting for 5 days and won't let up. 6/10 pain in the back.    Pertinent History  TKR 08/26/17; arthritis, depresssion    Patient Stated Goals  to improve movement and strength    Currently in Pain?  No/denies   just tightness in her knee                      [facility] Adult PT Treatment/Exercise - 09/30/17 0001      Exercises   Exercises  Knee/Hip      Knee/Hip Exercises: Aerobic   Recumbent Bike  L0 x 5 min due (no resistance due to LBP)      Knee/Hip Exercises: Supine   Bridges  Strengthening;Both;1 set;10 reps    Bridges with [employer]  Strengthening;Both;10 reps   after ball squeeze x 10   Straight Leg Raises  Strengthening;Left;2 sets;10 reps       Knee/Hip Exercises: Prone   Straight Leg Raises  Strengthening;Both;1 set;5 reps   with pelvic press   Straight Leg Raises Limitations  --    Other Prone Exercises  prone press ups 2x10; pelvic press series 5sec hold x5; unilateral and bil knee bends x 5 with press      Modalities   Modalities  Electrical Stimulation;Moist Heat      Moist Heat Therapy   Number Minutes Moist Heat  15 Minutes    Moist Heat Location  Lumbar Spine      Electrical Stimulation   Electrical Stimulation Location  lumbar    Electrical Stimulation Action  premod    Electrical Stimulation Parameters  supine    Electrical Stimulation Goals  Pain      Manual Therapy   Manual Therapy  Myofascial release;Passive ROM    Manual therapy comments  SDLY QL release R over roll    Myofascial Release  to R QL in SDLY over bolster    Passive ROM  prone knee flex stretch  PT Education - 09/30/17 1621    Education Details  HEP    Person(s) Educated  Patient    Methods  Explanation;Demonstration    Comprehension  Returned demonstration;Verbalized understanding          PT Long Term Goals - 09/22/17 0938      PT LONG TERM GOAL #1   Title  Patient to improve prone knee flexion to 100 degrees or more.    Time  4    Period  Weeks    Status  New      PT LONG TERM GOAL #2   Title  Patient able to perform lift and carry similar to work function without difficulty.    Time  4    Period  Weeks    Status  New      PT LONG TERM GOAL #3   Title  Patient able to perform ADLs with 2/10 pain or less.    Time  4    Period  Weeks    Status  New      PT LONG TERM GOAL #4   Title  Patient able to perform functional squat with equal weightbearing.    Time  4    Period  Weeks    Status  New            Plan - 09/30/17 8921    Clinical Impression Statement  Patient presents today with c/o of LBP x 5 days. She did report steaming her wood floors the day prior to pain starting. Pain is in  the R low back and pain is with transitional movements mainly. She tolerated TE well and was educated in correct body mechanics to protect low back with ADLs. She did experience pain in the left knee with hip flexor stretching after prone TE. She denies having any pain here with stretching at home. She responded well to QL release as well. She has slight quad lag with SLR. No goals met as only second visit.    Rehab Potential  Excellent    PT Frequency  2x / week    PT Duration  4 weeks    PT Treatment/Interventions  ADLs/Self Care Home Management;Cryotherapy;Lobbyist;Therapeutic exercise;Therapeutic activities;Patient/family education;Manual techniques;Taping;Vasopneumatic Device    PT Next Visit Plan  Assess if LBP has resolved; return to knee focus.Marland KitchenMarland KitchenL knee flexion (prone), strength, functional squats and lifting for RTW    PT Home Exercise Plan  wall slide, prone quad stretch with strap, supine hip flexor stretch, SLR and bridge    Consulted and Agree with Plan of Care  Patient       Patient will benefit from skilled therapeutic intervention in order to improve the following deficits and impairments:  Decreased range of motion, Pain, Increased muscle spasms, Impaired flexibility, Decreased strength, Increased edema  Visit Diagnosis: Stiffness of left knee, not elsewhere classified  Muscle weakness (generalized)     Problem List Patient Active Problem List   Diagnosis Date Noted  . Status post total left knee replacement 08/26/2017  . Unilateral primary osteoarthritis, left knee 07/16/2017  . Collagenous colitis 05/24/2016    Kylor Valverde PT 09/30/2017, 4:24 PM  [address] [place], [place], [address] Phone: [phone]   Fax:  [phone]  Name: Renee [name] MRN: [id number] Date of Birth: 20-Jul-1950

## 2017-09-30 NOTE — Patient Instructions (Signed)
Bridge    Lie back, raise hips up toward ceiling. Hold 3-5 sec and return to start position Repeat __10-30__ times. Do ___1_ sessions per day.  http://pm.exer.us/55   Copyright  VHI. All rights reserved.    10Straight Leg Raise    Tighten stomach and slowly raise locked right leg _10___ inches from floor. Repeat 10____ times per set. Do ___1-3_ sets per session. Do _2___ sessions per day.  http://orth.exer.us/1103    Solon Palm, PT 09/30/17 8:47 AM; [facility] [address] [place], [place], [address] Phone: [phone]   Fax:  [phone]   Copyright  VHI. All rights reserved.

## 2017-10-02 ENCOUNTER — Ambulatory Visit: Payer: PPO | Admitting: Physical Therapy

## 2017-10-05 ENCOUNTER — Other Ambulatory Visit: Payer: Self-pay

## 2017-10-05 ENCOUNTER — Encounter (HOSPITAL_BASED_OUTPATIENT_CLINIC_OR_DEPARTMENT_OTHER): Payer: Self-pay | Admitting: *Deleted

## 2017-10-05 ENCOUNTER — Emergency Department (HOSPITAL_BASED_OUTPATIENT_CLINIC_OR_DEPARTMENT_OTHER)
Admission: EM | Admit: 2017-10-05 | Discharge: 2017-10-05 | Disposition: A | Discharge status: Home / Self Care | Payer: PPO | Attending: Emergency Medicine | Admitting: Emergency Medicine

## 2017-10-05 DIAGNOSIS — Z9884 Bariatric surgery status: Secondary | ICD-10-CM | POA: Diagnosis not present

## 2017-10-05 DIAGNOSIS — F17210 Nicotine dependence, cigarettes, uncomplicated: Secondary | ICD-10-CM | POA: Insufficient documentation

## 2017-10-05 DIAGNOSIS — Z7982 Long term (current) use of aspirin: Secondary | ICD-10-CM | POA: Diagnosis not present

## 2017-10-05 DIAGNOSIS — M545 Low back pain, unspecified: Secondary | ICD-10-CM

## 2017-10-05 DIAGNOSIS — F329 Major depressive disorder, single episode, unspecified: Secondary | ICD-10-CM | POA: Insufficient documentation

## 2017-10-05 DIAGNOSIS — Z96652 Presence of left artificial knee joint: Secondary | ICD-10-CM | POA: Diagnosis not present

## 2017-10-05 DIAGNOSIS — Z79899 Other long term (current) drug therapy: Secondary | ICD-10-CM | POA: Insufficient documentation

## 2017-10-05 DIAGNOSIS — M549 Dorsalgia, unspecified: Secondary | ICD-10-CM | POA: Diagnosis present

## 2017-10-05 MED ORDER — CYCLOBENZAPRINE HCL 10 MG PO TABS: 10.0000 mg | ORAL_TABLET | Freq: Two times a day (BID) | ORAL | 0 refills | Status: DC | PRN

## 2017-10-05 MED ORDER — DIAZEPAM 5 MG PO TABS
2.5000 mg | ORAL_TABLET | Freq: Once | ORAL | Status: AC
  Administered 2017-10-05: 2.5 mg via ORAL
  Filled 2017-10-05: qty 1

## 2017-10-05 MED ORDER — IBUPROFEN 400 MG PO TABS
600.0000 mg | ORAL_TABLET | Freq: Once | ORAL | Status: AC
  Administered 2017-10-05: 600 mg via ORAL
  Filled 2017-10-05: qty 1

## 2017-10-05 NOTE — ED Provider Notes (Signed)
[facility] EMERGENCY DEPARTMENT Provider Note   CSN: [id number] Arrival date & time: 10/05/17  1918     History   Chief Complaint Chief Complaint  Patient presents with  . Back Pain    HPI Renee [name] is a 67 y.o. female who presents today for evaluation of right-sided back pain.  She had a left knee replacement on July 30 and since then has been doing well.  She has had right-sided lower back pain since last week in August.  She has been seen for PT for her knee for this.  She says that the pain has been gradually worsening.  Denies any abdominal pain, no numbness or tingling.  The pain is temporarily improved with over-the-counter pain medicines however then returns.  She has been trying heat without significant relief.  She denies any urinary symptoms.  No Personal history of cancer or IV drug use.  No changes to bowel/bladder function, no numbness or tingling across upper inner thighs, genitals, or legs.  HPI  Past Medical History:  Diagnosis Date  . Anemia   . Arthritis   . Collagenous colitis   . Depression   . Edema     Patient Active Problem List   Diagnosis Date Noted  . Status post total left knee replacement 08/26/2017  . Unilateral primary osteoarthritis, left knee 07/16/2017  . Collagenous colitis 05/24/2016    Past Surgical History:  Procedure Laterality Date  . CARDIAC CATHETERIZATION  2017  . CESAREAN SECTION    . GASTRIC BYPASS    . TOTAL KNEE ARTHROPLASTY Left 08/26/2017   Procedure: LEFT TOTAL KNEE ARTHROPLASTY;  Surgeon: Kathryne Hitch, MD;  [facility];  Service: Orthopedics;  Laterality: Left;     OB History   None      Home Medications    Prior to Admission medications   Medication Sig Start Date End Date Taking? Authorizing Provider  aspirin 81 MG chewable tablet Chew 1 tablet (81 mg total) by mouth 2 (two) times daily. 08/28/17   Kirtland Bouchard, PA-C  buPROPion (WELLBUTRIN XL) 150 MG 24 hr tablet Take 300 mg by  mouth daily.  03/22/15   [provider]  citalopram (CELEXA) 40 MG tablet Take 40 mg by mouth daily.     [provider]  cyanocobalamin (,VITAMIN B-12,) 1000 MCG/ML injection Inject 1,000 mcg into the muscle every 30 (thirty) days.    [provider]  cyclobenzaprine (FLEXERIL) 10 MG tablet Take 1 tablet (10 mg total) by mouth 2 (two) times daily as needed for muscle spasms. 10/05/17   Cristina Gong, PA-C  Ferrous Sulfate Dried (SLOW RELEASE IRON) 45 MG TBCR Take 45 mg by mouth daily.    [provider]  hydrochlorothiazide (HYDRODIURIL) 25 MG tablet Take 25 mg by mouth daily.    [provider]  Multiple Vitamins-Minerals (ADULT GUMMY PO) Take 1 tablet by mouth daily.    [provider]  oxyCODONE-acetaminophen (PERCOCET) 5-325 MG tablet Take 1 tablet by mouth every 4 (four) hours as needed for severe pain. 09/04/17 09/04/18  Kirtland Bouchard, PA-C  Potassium 99 MG TABS Take 99 mg by mouth daily.    [provider]  Syringe/Needle, Disp, 27G X 5/8" 1 ML MISC Use once monthly for subcutaneous  B12 injections 04/03/16   Nandigam, Eleonore Chiquito, MD  traZODone (DESYREL) 50 MG tablet TAKE 2 TABLETS BY MOUTH EVERY NIGHT AT BEDTIME 05/19/15   [provider]  Family History Family History  Problem Relation Age of Onset  . Diabetes Mother   . Heart disease Mother   . Heart disease Father   . Breast cancer Maternal Grandmother   . Colon cancer Paternal Grandfather     Social History Social History   Tobacco Use  . Smoking status: Current Every Day Smoker    Packs/day: 0.50    Types: Cigarettes  . Smokeless tobacco: Never Used  . Tobacco comment: social  Substance Use Topics  . Alcohol use: No  . Drug use: No     Allergies   Psyllium   Review of Systems Review of Systems  Constitutional: Negative for chills and fever.  Respiratory: Negative for chest tightness and shortness of breath.   Cardiovascular: Negative  for chest pain.  Gastrointestinal: Negative for abdominal pain.  Musculoskeletal: Positive for back pain. Negative for arthralgias, myalgias and neck pain.  Neurological: Negative for weakness.  All other systems reviewed and are negative.    Physical Exam Updated Vital Signs BP 101/62 (BP Location: Right Arm)   Pulse 72   Temp 97.9 F (36.6 C)   Resp 18   Ht 5\' 5"  (1.651 m)   Wt 65.8 kg   SpO2 99%   BMI 24.13 kg/m   Physical Exam  Constitutional: She is oriented to person, place, and time. She appears well-developed and well-nourished. No distress.  HENT:  Head: Normocephalic.  Cardiovascular: Intact distal pulses.  2+ DP/PT pulses bilaterally.  Musculoskeletal:  Normal gait, 5/5 strength with bilateral plantar flexion and dorsiflexion.  There is no midline T/L-spine tenderness to palpation, step-offs, or deformities.  There is right lower lumbar paraspinal muscle tenderness to palpation.  Palpation over this area both re-creates and exacerbates her reported pain.  No masses palpated.  Neurological: She is alert and oriented to person, place, and time.  Sensation intact to bilateral lower extremities.  Skin: She is not diaphoretic.  Nursing note and vitals reviewed.    ED Treatments / Results  Labs (all labs ordered are listed, but only abnormal results are displayed) Labs Reviewed - No data to display  EKG None  Radiology No results found.  Procedures Procedures (including critical care time)  Medications Ordered in ED Medications  diazepam (VALIUM) tablet 2.5 mg (2.5 mg Oral Given 10/05/17 2117)  ibuprofen (ADVIL,MOTRIN) tablet 600 mg (600 mg Oral Given 10/05/17 2117)     Initial Impression / Assessment and Plan / ED Course  I have reviewed the triage vital signs and the nursing notes.  Pertinent labs & imaging results that were available during my care of the patient were reviewed by me and considered in my medical decision making (see chart for  details).    Patient with back pain.  No neurological deficits and normal neuro exam.  Patient can walk but states is painful.  No loss of bowel or bladder control.  No concern for cauda equina.  No fever, night sweats, weight loss, h/o cancer, IVDU.  Her back pain has been going on for multiple weeks, history is not consistent with  AAA.  She has localized tenderness to palpation over the right-sided paraspinal muscles.  Suspect that this is muscular spasm.  She was offered x-rays, however refused.  RICE protocol and pain medicine indicated and discussed with patient.   This patient was seen as a shared visit with Dr. Silverio Lay.   Final Clinical Impressions(s) / ED Diagnoses   Final diagnoses:  Acute right-sided low back pain without sciatica  ED Discharge Orders         Ordered    cyclobenzaprine (FLEXERIL) 10 MG tablet  2 times daily PRN     10/05/17 2146           Cristina Gong, PA-C 10/05/17 2157    Charlynne Pander, MD 10/08/17 [phone]

## 2017-10-05 NOTE — ED Triage Notes (Signed)
Pt had left knee replacement on July 30. Surgery went well. States back pain began last week in August. Has seen PT and had TENS unit. Pain is worse with certain movements

## 2017-10-05 NOTE — Discharge Instructions (Signed)
Today you received medications that may make you sleepy or impair your ability to make decisions.  For the next 24 hours please do not drive, operate heavy machinery, care for a small child with out another adult present, or perform any activities that may cause harm to you or someone else if you were to fall asleep or be impaired.   You are being prescribed a medication which may make you sleepy. Please follow up of listed precautions for at least 24 hours after taking one dose.  

## 2017-10-05 NOTE — ED Notes (Signed)
Assisted pt to her vehicle. Pt requested to ambulate with standby assist

## 2017-10-13 ENCOUNTER — Ambulatory Visit (INDEPENDENT_AMBULATORY_CARE_PROVIDER_SITE_OTHER): Payer: PPO | Admitting: Physician Assistant

## 2017-10-13 ENCOUNTER — Encounter (INDEPENDENT_AMBULATORY_CARE_PROVIDER_SITE_OTHER): Payer: Self-pay | Admitting: Physician Assistant

## 2017-10-13 VITALS — Ht 63.0 in | Wt 143.0 lb

## 2017-10-13 DIAGNOSIS — Z96652 Presence of left artificial knee joint: Secondary | ICD-10-CM

## 2017-10-13 NOTE — Progress Notes (Signed)
Mrs.Kuenzi returns today 48 days status post left total knee arthroplasty.  She states overall the knee is doing great.  She has some discomfort in the anterior left shin.  She had no known injury.  She did go to the ER due to some back pain she is given muscle relaxants is feeling better.  She denies any numbness tingling down the leg.  She missed therapy last week.  She remains out of work at this point time she does work at [employer] in [employer].  Physical exam: Surgical incisions well-healed she has full extension flexion to 115 degrees.  Calf supple nontender.  There is no rashes skin lesions ulcerations erythema or ecchymosis of the knee or lower leg.  No instability with valgus varus stressing.  She has slight tenderness over the distal one fourth of the tibia over the tibial spine area.  No significant edema in this area no ecchymosis.  Impression: Status post left total knee arthroplasty 07/16/2017  Plan: She will continue work on range of motion strengthening the knee.  In regards to the tibial pain this could definitely be related to her surgery will watch it for now.  She will return to work next Monday 6 hours a day for 2 weeks and then return to full duties after that time of 8 hours today.  Questions were encouraged and answered.

## 2017-10-15 ENCOUNTER — Encounter: Payer: Self-pay | Admitting: Physical Therapy

## 2017-10-15 ENCOUNTER — Ambulatory Visit: Payer: PPO | Admitting: Physical Therapy

## 2017-10-15 DIAGNOSIS — M25662 Stiffness of left knee, not elsewhere classified: Secondary | ICD-10-CM | POA: Diagnosis not present

## 2017-10-15 DIAGNOSIS — R600 Localized edema: Secondary | ICD-10-CM

## 2017-10-15 DIAGNOSIS — M6281 Muscle weakness (generalized): Secondary | ICD-10-CM

## 2017-10-15 NOTE — Therapy (Addendum)
[place] [address] [place], [place], [address] Phone: [phone]   Fax:  [phone]  Physical Therapy Treatment  Patient Details  Name: Renee [name] MRN: [id number] Date of Birth: [birth year] Referring Provider: Erskine Emery   Encounter Date: 10/15/2017  PT End of Session - 10/15/17 0927    Visit Number  3    Date for PT Re-Evaluation  10/20/17    PT Start Time  0845    PT Stop Time  0940    PT Time Calculation (min)  55 min    Activity Tolerance  Patient tolerated treatment well    Behavior During Therapy  [facility] for tasks assessed/performed       Past Medical History:  Diagnosis Date  . Anemia   . Arthritis   . Collagenous colitis   . Depression   . Edema     Past Surgical History:  Procedure Laterality Date  . CARDIAC CATHETERIZATION  2017  . CESAREAN SECTION    . GASTRIC BYPASS    . TOTAL KNEE ARTHROPLASTY Left 08/26/2017   Procedure: LEFT TOTAL KNEE ARTHROPLASTY;  Surgeon: Mcarthur Rossetti, MD;  Location: [place];  Service: Orthopedics;  Laterality: Left;    There were no vitals filed for this visit.  Subjective Assessment - 10/15/17 0845    Subjective  "Knee feels real good" "back is better but I can feel where it all originated from, and that has me worried about going back to work"    Currently in Pain?  No/denies    Pain Score  0-No pain         [facility] PT Assessment - 10/15/17 0001      AROM   AROM Assessment Site  Knee    Right/Left Knee  Left    Left Knee Extension  2    Left Knee Flexion  125                   [facility] Adult PT Treatment/Exercise - 10/15/17 0001      Exercises   Exercises  Knee/Hip      Knee/Hip Exercises: Aerobic   Nustep  L5 x 4 min       Knee/Hip Exercises: Machines for Strengthening   Cybex Knee Extension  5lb 2x10    Cybex Knee Flexion  20lb 2x10, LLE 10lb x10    Cybex Leg Press  20lb 2x10       Knee/Hip Exercises: Standing   Forward Step Up  Left;1 set;10 reps;Hand Hold: 1;Hand Hold: 0;Step Height: 6"      Knee/Hip Exercises: Seated   Long Arc Quad  Left;2 sets;15 reps;Weights    Long Arc Quad Weight  3 lbs.      Modalities   Modalities  Vasopneumatic      Vasopneumatic   Number Minutes Vasopneumatic   15 minutes    Vasopnuematic Location   Knee    Vasopneumatic Pressure  Medium    Vasopneumatic Temperature   33                  PT Long Term Goals - 10/15/17 0857      PT LONG TERM GOAL #1   Title  Patient to improve prone knee flexion to 100 degrees or more.    Status  Achieved      PT LONG TERM GOAL #3   Title  Patient able to perform ADLs with 2/10 pain or less.  Status  Achieved            Plan - 10/15/17 3112    Clinical Impression Statement  Pt tolerated today's treatment well evident by no subjective reports of pain. Pt has full L knee AROM. Cues to control the eccentric phase on leg press. Needed HHA X1 with the first initial reps of step ups. Pt has been to the ED for her pack and has noticed some improvements.     Rehab Potential  Excellent    PT Treatment/Interventions  ADLs/Self Care Home Management;Cryotherapy;Lobbyist;Therapeutic exercise;Therapeutic activities;Patient/family education;Manual techniques;Taping;Vasopneumatic Device    PT Next Visit Plan  Assess if LBP has resolved;  strength, functional squats and lifting for RTW       Patient will benefit from skilled therapeutic intervention in order to improve the following deficits and impairments:  Decreased range of motion, Pain, Increased muscle spasms, Impaired flexibility, Decreased strength, Increased edema  Visit Diagnosis: Stiffness of left knee, not elsewhere classified  Muscle weakness (generalized)  Localized edema     Problem List Patient Active Problem List   Diagnosis Date Noted  . Status post total left knee replacement 08/26/2017  . Unilateral primary  osteoarthritis, left knee 07/16/2017  . Collagenous colitis 05/24/2016   PHYSICAL THERAPY DISCHARGE SUMMARY   Plan: Patient agrees to discharge.  Patient goals were not met. Patient is being discharged due to not returning since the last visit.  ?????       Scot Jun, PTA 10/15/2017, 9:30 AM  [address] [address] [place], [place], [address] Phone: [phone]   Fax:  [phone]  Name: Renee [name] MRN: [id number] Date of Birth: September 23, 1950

## 2017-11-10 ENCOUNTER — Ambulatory Visit (INDEPENDENT_AMBULATORY_CARE_PROVIDER_SITE_OTHER): Payer: PPO | Admitting: Physician Assistant

## 2018-01-11 DIAGNOSIS — R062 Wheezing: Secondary | ICD-10-CM | POA: Diagnosis not present

## 2018-01-11 DIAGNOSIS — J40 Bronchitis, not specified as acute or chronic: Secondary | ICD-10-CM | POA: Diagnosis not present

## 2018-08-31 IMAGING — DX DG KNEE 1-2V PORT*L*
2 series · 2 of 2 positions shown · non-contrast
Comparison: None.

CLINICAL DATA: Status post left total knee replacement.

EXAM:
PORTABLE LEFT KNEE - 1-2 VIEW

[knee ap]
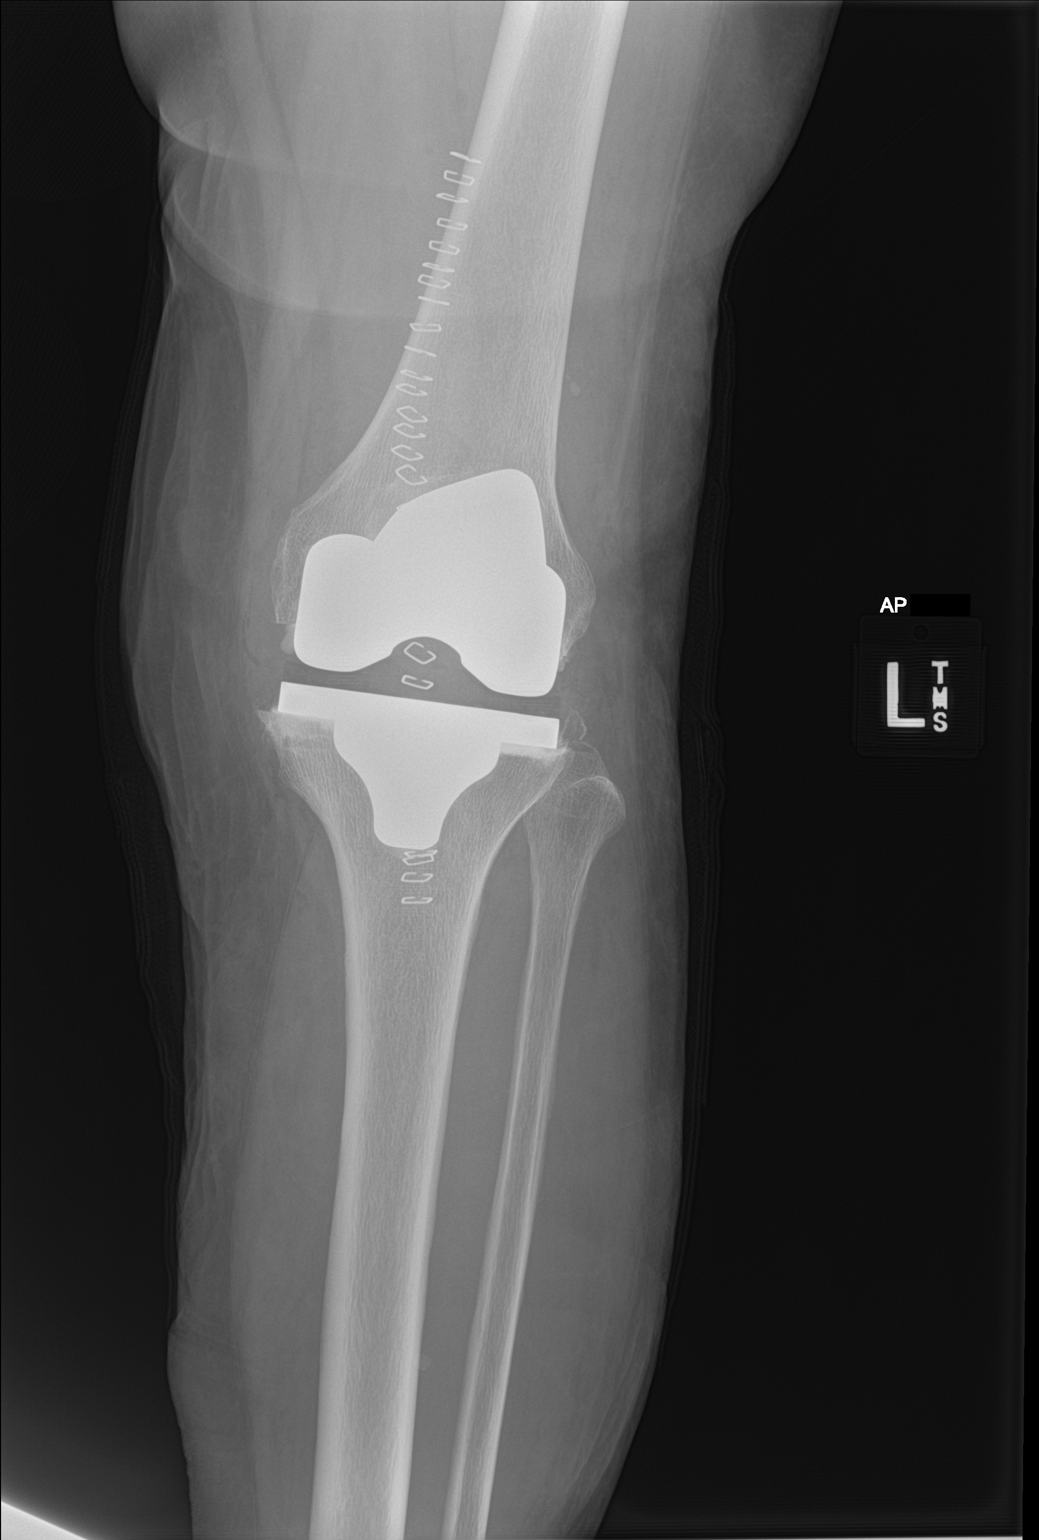

[knee lat]
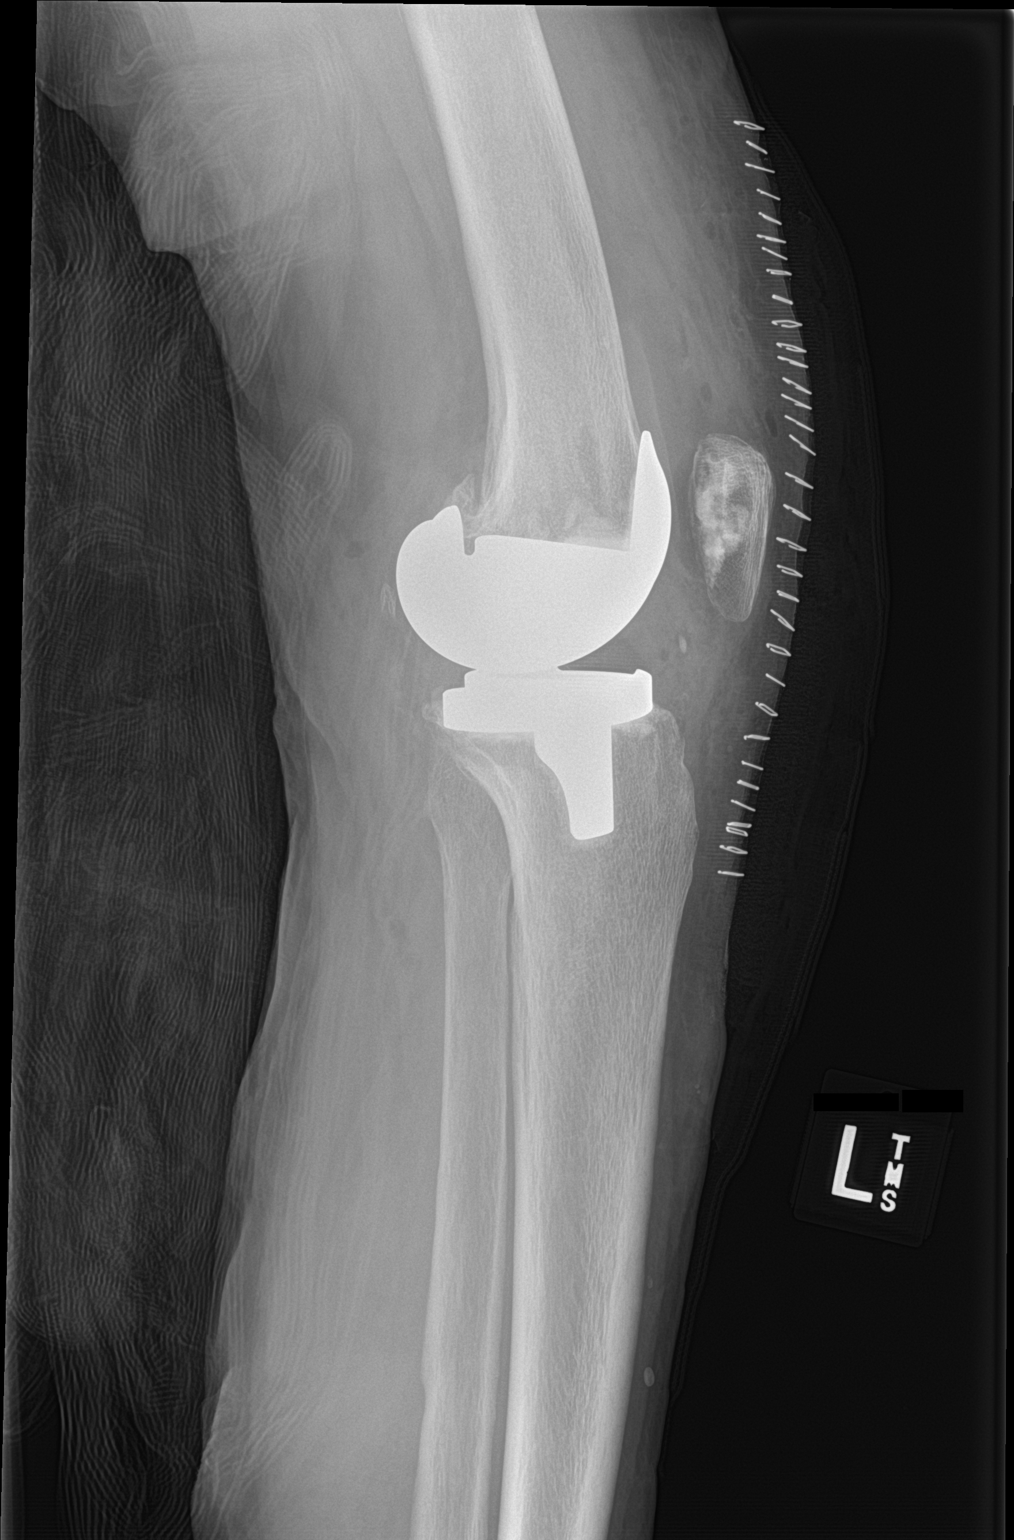

[2 of 2 positions shown; findings below may reference images not displayed]

FINDINGS: The femoral and tibial components appear to be well situated.
Expected postoperative changes are noted in the soft tissues
anteriorly. No fracture or dislocation is noted.
IMPRESSION: Status post left total knee arthroplasty.

## 2020-07-13 ENCOUNTER — Ambulatory Visit: Payer: PPO | Admitting: Physician Assistant

## 2021-06-12 ENCOUNTER — Ambulatory Visit: Payer: Medicare HMO | Admitting: Gastroenterology

## 2021-06-27 NOTE — Progress Notes (Signed)
07/03/2021 Renee [name] [id number] 20-Nov-1950  Referring provider: Burnis Medin, * Primary GI doctor: Dr. Lavon Paganini  ASSESSMENT AND PLAN:   Diarrhea with History of collagenous colitis Last colonoscopy 06/08/2015 colon random biopsies showed findings suggestive of collagenous colitis.  She is status post gastric bypass surgery. Has not had a flare since 2017, 3 weeks of diarrhea, no symptoms with it. No ABX use but will rule out other causes of the diarrhea as it seems to be improving somewhat.  Will treat with questran as she responded to this previously.  Some stress with her job? If IBS component, given information about FODMAP Advised to stop ETOH/smoking -     GI Profile, Stool, PCR; Future -     CBC with Differential/Platelet; Future -     Comprehensive metabolic panel; Future -     TSH; Future -     High sensitivity CRP; Future -     cholestyramine (QUESTRAN) 4 g packet; Take 1 packet (4 g total) by mouth 2 (two) times daily. -     DG Abd 1 View; Future  Collagenous colitis -     GI Profile, Stool, PCR; Future -     CBC with Differential/Platelet; Future -     Comprehensive metabolic panel; Future -     TSH; Future -     High sensitivity CRP; Future -     cholestyramine (QUESTRAN) 4 g packet; Take 1 packet (4 g total) by mouth 2 (two) times daily. -     DG Abd 1 View; Future If not improving, everything negative, can consider repeat colonoscopy Treating for SIBO with some bloating.  Avoid lactose and high fructose Avoid nsaids We'll check CMP and CBC    Patient Care Team: [place], [place] as PCP - General (Family Medicine)  HISTORY OF PRESENT ILLNESS: 71 y.o. female with a past medical history of depression, iron deficiency, history of gastric bypass surgery, collagenous colitis and others listed below presents for evaluation of collagenous colitis.   She switched her care from [facility] and established with [facility] October 2017,  diagnosed in 2017 due to anemia, diarrhea and electrolytes.  Has been treated with budesonide in the past with good results, recurrence end of February 2018. Had stopped ibuprofen March 2018 per recommendations, very rare once a month on advil.   She was having normal stools twice a day, formed, no blood in the stool.  She states 3 weeks ago she started to have watery stools.  She has not tried any medication for the diarrhea.  She states from 7 AM until 2 pm 4 days a week at work, she does not have a BM, she states she is very anxious about having to have a BM there but does not normally.  When she is not at work, she will have every hour to 1.5 hours.  Has not been on trazodone for 4 weeks, not sleeping well at night.  Then she will have up to 10 times. Can have nocturnal symptoms 2-3 x a week.  She states last 2-3 days have had some soft one.  She denies nausea, vomiting. She has some bloating and gas but no AB pain.  Patient denies blood in stool, fever, chills, unintentional weight loss.  Denies close contacts ill, recent camping, recent travel. Patient denies recent antibiotic use. Admits her diet is not the best, she states last 3-4 years she has not wanted to have fruit/veggies.  She  eats carbs mainly and dairy per patient.  She will have 2 glasses of wine a day and she has smoked for the last 7 years.   05/23/2016 patient last seen was given budesonide 9 mg 12-week course, Pepto-Bismol 3 tablets 3 times daily and Colesytramine 4 g daily. 06/08/2015 EGD/colonoscopy for evaluation of chronic diarrhea and iron deficiency anemia, and colon random biopsies showed findings suggestive of collagenous colitis. She is status post gastric bypass surgery.  Current Medications:    Current Outpatient Medications (Cardiovascular):    cholestyramine (QUESTRAN) 4 g packet, Take 1 packet (4 g total) by mouth 2 (two) times daily.   losartan-hydrochlorothiazide (HYZAAR) 50-12.5 MG tablet, Take 1  tablet by mouth daily.   Current Outpatient Medications (Analgesics):    aspirin 81 MG chewable tablet, Chew 1 tablet (81 mg total) by mouth 2 (two) times daily.  Current Outpatient Medications (Hematological):    vitamin B-12 (CYANOCOBALAMIN) 100 MCG tablet, Take 100 mcg by mouth daily.  Current Outpatient Medications (Other):    citalopram (CELEXA) 40 MG tablet, Take 40 mg by mouth daily.    Multiple Vitamins-Minerals (ADULT GUMMY PO), Take 1 tablet by mouth daily.   Potassium 99 MG TABS, Take 99 mg by mouth daily.   traZODone (DESYREL) 50 MG tablet, TAKE 2 TABLETS BY MOUTH EVERY NIGHT AT BEDTIME  Medical History:  Past Medical History:  Diagnosis Date   Anemia    Arthritis    Collagenous colitis    Depression    Edema    Allergies:  Allergies  Allergen Reactions   Psyllium Anaphylaxis     Surgical History:  She  has a past surgical history that includes Cesarean section; Gastric bypass; Cardiac catheterization (2017); and Total knee arthroplasty (Left, 08/26/2017). Family History:  Her family history includes Breast cancer in her maternal grandmother; Colon cancer in her paternal grandfather; Diabetes in her mother; Heart disease in her father and mother. Social History:   reports that she has been smoking cigarettes. She has been smoking an average of .5 packs per day. She has never used smokeless tobacco. She reports that she does not drink alcohol and does not use drugs.  REVIEW OF SYSTEMS  : All other systems reviewed and negative except where noted in the History of Present Illness.   PHYSICAL EXAM: BP 118/64   Pulse 80   Ht 5\' 2"  (1.575 m)   Wt 144 lb (65.3 kg)   BMI 26.34 kg/m  General:   Pleasant, well developed female in no acute distress Head:   Normocephalic and atraumatic. Eyes:  sclerae anicteric,conjunctive pink  Heart:   regular rate and rhythm Pulm:  Clear anteriorly; no wheezing Abdomen:   Soft, Flat AB, Active bowel sounds. No tenderness . , No  organomegaly appreciated. Rectal: Not evaluated Extremities:  Without edema. Msk: Symmetrical without gross deformities. Peripheral pulses intact.  Neurologic:  Alert and  oriented x4;  No focal deficits.  Skin:   Dry and intact without significant lesions or rashes. Psychiatric:  Cooperative. Normal mood and affect.  , PA-C 11:38 AM

## 2021-07-03 ENCOUNTER — Other Ambulatory Visit (INDEPENDENT_AMBULATORY_CARE_PROVIDER_SITE_OTHER): Payer: Medicare HMO

## 2021-07-03 ENCOUNTER — Ambulatory Visit (INDEPENDENT_AMBULATORY_CARE_PROVIDER_SITE_OTHER)
Admission: RE | Admit: 2021-07-03 | Discharge: 2021-07-03 | Disposition: A | Discharge status: Home / Self Care | Payer: Medicare HMO | Source: Ambulatory Visit | Attending: Physician Assistant | Admitting: Physician Assistant

## 2021-07-03 ENCOUNTER — Ambulatory Visit: Payer: Medicare HMO | Admitting: Physician Assistant

## 2021-07-03 ENCOUNTER — Encounter: Payer: Self-pay | Admitting: Physician Assistant

## 2021-07-03 VITALS — BP 118/64 | HR 80 | Ht 62.0 in | Wt 144.0 lb

## 2021-07-03 DIAGNOSIS — A09 Infectious gastroenteritis and colitis, unspecified: Secondary | ICD-10-CM | POA: Diagnosis not present

## 2021-07-03 DIAGNOSIS — K52831 Collagenous colitis: Secondary | ICD-10-CM

## 2021-07-03 LAB — CBC WITH DIFFERENTIAL/PLATELET
Basophils Absolute: 0.1 10*3/uL (ref 0.0–0.1)
Basophils Relative: 1.7 % (ref 0.0–3.0)
Eosinophils Absolute: 0.2 10*3/uL (ref 0.0–0.7)
Eosinophils Relative: 4 % (ref 0.0–5.0)
HCT: 40.9 % (ref 36.0–46.0)
Hemoglobin: 13.9 g/dL (ref 12.0–15.0)
Lymphocytes Relative: 48.1 % — ABNORMAL HIGH (ref 12.0–46.0)
Lymphs Abs: 2.5 10*3/uL (ref 0.7–4.0)
MCHC: 33.9 g/dL (ref 30.0–36.0)
MCV: 94.4 fl (ref 78.0–100.0)
Monocytes Absolute: 0.3 10*3/uL (ref 0.1–1.0)
Monocytes Relative: 6.4 % (ref 3.0–12.0)
Neutro Abs: 2.1 10*3/uL (ref 1.4–7.7)
Neutrophils Relative %: 39.8 % — ABNORMAL LOW (ref 43.0–77.0)
Platelets: 274 10*3/uL (ref 150.0–400.0)
RBC: 4.33 Mil/uL (ref 3.87–5.11)
RDW: 14.6 % (ref 11.5–15.5)
WBC: 5.2 10*3/uL (ref 4.0–10.5)

## 2021-07-03 LAB — TSH: TSH: 0.67 u[IU]/mL (ref 0.35–5.50)

## 2021-07-03 LAB — COMPREHENSIVE METABOLIC PANEL
ALT: 16 U/L (ref 0–35)
AST: 24 U/L (ref 0–37)
Albumin: 4 g/dL (ref 3.5–5.2)
Alkaline Phosphatase: 58 U/L (ref 39–117)
BUN: 16 mg/dL (ref 6–23)
CO2: 29 mEq/L (ref 19–32)
Calcium: 9.3 mg/dL (ref 8.4–10.5)
Chloride: 101 mEq/L (ref 96–112)
Creatinine, Ser: 0.53 mg/dL (ref 0.40–1.20)
GFR: 93.53 mL/min (ref >60.00)
Glucose, Bld: 107 mg/dL — ABNORMAL HIGH (ref 70–99)
Potassium: 3.7 mEq/L (ref 3.5–5.1)
Sodium: 137 mEq/L (ref 135–145)
Total Bilirubin: 0.6 mg/dL (ref 0.2–1.2)
Total Protein: 7 g/dL (ref 6.0–8.3)

## 2021-07-03 LAB — HIGH SENSITIVITY CRP: CRP, High Sensitivity: 2.99 mg/L (ref 0.000–5.000)

## 2021-07-03 MED ORDER — CHOLESTYRAMINE 4 G PO PACK: 4.0000 g | PACK | Freq: Two times a day (BID) | ORAL | 2 refills | Status: AC

## 2021-07-03 NOTE — Patient Instructions (Signed)
Your provider has requested that you go to the basement level for an Xray of your abdomen before leaving today.  Press "B" on the elevator.  The lab is located at the first door on the left as you exit the elevator.  If diarrhea is severe - 4+ watery diarrhea episodes, take imodium to reduce fluid loss, and start on electrolyte supplemented fluids.   Please go to the ER if you have any severe AB pain, unable to hold down food/water, blood in stool or vomit, chest pain, shortness of breath, or any worsening symptoms.    Consider keeping a food diary- common causes of diarrhea are dairy, certain carbs...   FODMAP stands for fermentable oligo-, di-, mono-saccharides and polyols (1). These are the scientific terms used to classify groups of carbs that are notorious for triggering digestive symptoms like bloating, gas and stomach pain.   FODMAPs are found in a wide range of foods in varying amounts. Some foods contain just one type, while others contain several.  The main dietary sources of the four groups of FODMAPs include:  Oligosaccharides: Wheat, rye, legumes and various fruits and vegetables, such as garlic and onions.  Disaccharides: Milk, yogurt and soft cheese. Lactose is the main carb.  Monosaccharides: Various fruit including figs and mangoes, and sweeteners such as honey and agave nectar. Fructose is the main carb.  Polyols: Certain fruits and vegetables including blackberries and lychee, as well as some low-calorie sweeteners like those in sugar-free gum.   Keep a food diary. This will help you identify foods that cause symptoms. Write down: What you eat and when. What symptoms you have. When symptoms occur in relation to your meals. Avoid foods that cause symptoms. Talk with your dietitian about other ways to get the same nutrients that are in these foods. Eat your meals slowly, in a relaxed setting. Aim to eat 5-6 small meals per day. Do not skip meals. Drink enough fluids to  keep your urine clear or pale yellow. If dairy products cause your symptoms to flare up, try eating less of them. You might be able to handle yogurt better than other dairy products because it contains bacteria that help with digestion.      SMOKING CESSATION  [employer] cancer society  561-776-2991 for more information or for a free program for smoking cessation help.   You can call QUIT SMART 1-800-QUIT-NOW for free nicotine patches or replacement therapy- if they are out- keep calling  [facility] Can call for smoking cessation classes, (212)471-8327  If you have a smart phone, please look up Smoke Free app, this will help you stay on track and give you information about money you have saved, life that you have gained back and a ton of more information.     ADVANTAGES OF QUITTING SMOKING Within 20 minutes, blood pressure decreases. Your pulse is at normal level. After 8 hours, carbon monoxide levels in the blood return to normal. Your oxygen level increases. After 24 hours, the chance of having a heart attack starts to decrease. Your breath, hair, and body stop smelling like smoke. After 48 hours, damaged nerve endings begin to recover. Your sense of taste and smell improve. After 72 hours, the body is virtually free of nicotine. Your bronchial tubes relax and breathing becomes easier. After 2 to 12 weeks, lungs can hold more air. Exercise becomes easier and circulation improves. After 1 year, the risk of coronary heart disease is cut in half. After 5 years,  the risk of stroke falls to the same as a nonsmoker. After 10 years, the risk of lung cancer is cut in half and the risk of other cancers decreases significantly. After 15 years, the risk of coronary heart disease drops, usually to the level of a nonsmoker. You will have extra money to spend on things other than cigarettes.

## 2021-10-02 ENCOUNTER — Telehealth: Payer: Self-pay | Admitting: Physician Assistant

## 2021-10-02 NOTE — Telephone Encounter (Signed)
Spoke with patient regarding PA recommendations. She is going to try to have a family member drop off stool kit this week.

## 2021-10-02 NOTE — Telephone Encounter (Signed)
Patient called in with complaints of worsening diarrhea for the last few weeks (3-4 times/day). She takes [place] as prescribed, but it no longer provides relief. She states Dr. Silverio Decamp prescribed her budesonide a few years ago which did help. She has been reminded to turn in stool sample that was ordered at the time of her last OV in June, however she states d/t transportation she cannot come in unless her brother possibly brings the kit. Will route to PA for further recommendations.

## 2021-10-02 NOTE — Telephone Encounter (Signed)
Would like for her to turn in the stool samples and as soon as she turns it in can get the budesonide 3 mg, 9 mg daily for 60 days, then 6 mg daily for 14 days, then 3 mg daily for 14 days #222.  If she has an infection like Cdiff and we treat her with steroids, will make the infection worse.  Stay hydrated, do BRAT diet.  Go to the ER if you are not peeing regularly, unable to take oral fluids, vomiting, severe weakness/dizziness, severe abdominal pain , chest pain or shortness of breath.

## 2021-10-02 NOTE — Telephone Encounter (Signed)
Patient's brother, Alinda Money, said patient is having difficulty with severe diarrhea and was prescribed budesonide compound by Dr. Lavon Paganini previously.  He said that worked well for her and he feels like she needs it again as she is very sick and weak.  Please call Alinda Money and advise.  Thank you.

## 2021-10-02 NOTE — Telephone Encounter (Signed)
Refer to alternate phone note from today. 

## 2021-10-03 ENCOUNTER — Other Ambulatory Visit: Payer: Medicare HMO

## 2021-10-03 DIAGNOSIS — A09 Infectious gastroenteritis and colitis, unspecified: Secondary | ICD-10-CM

## 2021-10-03 DIAGNOSIS — K52831 Collagenous colitis: Secondary | ICD-10-CM

## 2021-10-04 LAB — GI PROFILE, STOOL, PCR

## 2021-10-05 ENCOUNTER — Telehealth: Payer: Self-pay | Admitting: Physician Assistant

## 2021-10-05 NOTE — Telephone Encounter (Signed)
We were getting slammed at the hospital. Sorry.  Yes, can fill the budesonide.  Renee [name]

## 2021-10-05 NOTE — Telephone Encounter (Signed)
Inbound call from patient requesting lab results. Please advise. 

## 2021-10-06 ENCOUNTER — Telehealth: Payer: Self-pay | Admitting: Physician Assistant

## 2021-10-06 MED ORDER — BUDESONIDE 3 MG PO CPEP: ORAL_CAPSULE | ORAL | 0 refills | Status: AC

## 2021-10-06 NOTE — Telephone Encounter (Signed)
Negative GI pathogen panel. With history of lymphocytic colitis, sent in budesonide taper to [facility] with good Rx. Patient will need follow-up visit.

## 2021-10-08 ENCOUNTER — Encounter (HOSPITAL_BASED_OUTPATIENT_CLINIC_OR_DEPARTMENT_OTHER): Payer: Self-pay | Admitting: Emergency Medicine

## 2021-10-08 ENCOUNTER — Emergency Department (HOSPITAL_BASED_OUTPATIENT_CLINIC_OR_DEPARTMENT_OTHER): Payer: Medicare HMO

## 2021-10-08 ENCOUNTER — Other Ambulatory Visit: Payer: Self-pay

## 2021-10-08 ENCOUNTER — Emergency Department (HOSPITAL_BASED_OUTPATIENT_CLINIC_OR_DEPARTMENT_OTHER)
Admission: EM | Admit: 2021-10-08 | Discharge: 2021-10-08 | Disposition: A | Discharge status: Home / Self Care | Payer: Medicare HMO | Attending: Emergency Medicine | Admitting: Emergency Medicine

## 2021-10-08 DIAGNOSIS — M79672 Pain in left foot: Secondary | ICD-10-CM | POA: Insufficient documentation

## 2021-10-08 DIAGNOSIS — E876 Hypokalemia: Secondary | ICD-10-CM | POA: Diagnosis not present

## 2021-10-08 DIAGNOSIS — Z7982 Long term (current) use of aspirin: Secondary | ICD-10-CM | POA: Insufficient documentation

## 2021-10-08 DIAGNOSIS — S92902A Unspecified fracture of left foot, initial encounter for closed fracture: Secondary | ICD-10-CM

## 2021-10-08 LAB — CBC WITH DIFFERENTIAL/PLATELET
Abs Immature Granulocytes: 0.03 10*3/uL (ref 0.00–0.07)
Basophils Absolute: 0.1 10*3/uL (ref 0.0–0.1)
Basophils Relative: 1 %
Eosinophils Absolute: 0.1 10*3/uL (ref 0.0–0.5)
Eosinophils Relative: 1 %
HCT: 41.8 % (ref 36.0–46.0)
Hemoglobin: 14.5 g/dL (ref 12.0–15.0)
Immature Granulocytes: 0 %
Lymphocytes Relative: 24 %
Lymphs Abs: 2.2 10*3/uL (ref 0.7–4.0)
MCH: 31.7 pg (ref 26.0–34.0)
MCHC: 34.7 g/dL (ref 30.0–36.0)
MCV: 91.3 fL (ref 80.0–100.0)
Monocytes Absolute: 0.6 10*3/uL (ref 0.1–1.0)
Monocytes Relative: 7 %
Neutro Abs: 6.3 10*3/uL (ref 1.7–7.7)
Neutrophils Relative %: 67 %
Platelets: 396 10*3/uL (ref 150–400)
RBC: 4.58 MIL/uL (ref 3.87–5.11)
RDW: 13.4 % (ref 11.5–15.5)
WBC: 9.2 10*3/uL (ref 4.0–10.5)
nRBC: 0 % (ref 0.0–0.2)

## 2021-10-08 LAB — BASIC METABOLIC PANEL
Anion gap: 6 (ref 5–15)
BUN: 22 mg/dL (ref 8–23)
CO2: 24 mmol/L (ref 22–32)
Calcium: 8.1 mg/dL — ABNORMAL LOW (ref 8.9–10.3)
Chloride: 105 mmol/L (ref 98–111)
Creatinine, Ser: 0.66 mg/dL (ref 0.44–1.00)
GFR, Estimated: >60 mL/min (ref >60)
Glucose, Bld: 96 mg/dL (ref 70–99)
Potassium: 3 mmol/L — ABNORMAL LOW (ref 3.5–5.1)
Sodium: 135 mmol/L (ref 135–145)

## 2021-10-08 LAB — C-REACTIVE PROTEIN: CRP: 1.3 mg/dL — ABNORMAL HIGH (ref <1.0)

## 2021-10-08 LAB — URIC ACID: Uric Acid, Serum: 4 mg/dL (ref 2.5–7.1)

## 2021-10-08 MED ORDER — HYDROCODONE-ACETAMINOPHEN 5-325 MG PO TABS: 1.0000 | ORAL_TABLET | Freq: Four times a day (QID) | ORAL | 0 refills | Status: DC | PRN

## 2021-10-08 MED ORDER — HYDROCODONE-ACETAMINOPHEN 5-325 MG PO TABS
1.0000 | ORAL_TABLET | Freq: Once | ORAL | Status: AC
  Administered 2021-10-08: 1 via ORAL
  Filled 2021-10-08: qty 1

## 2021-10-08 MED ORDER — HYDROCODONE-ACETAMINOPHEN 5-325 MG PO TABS: 1.0000 | ORAL_TABLET | Freq: Four times a day (QID) | ORAL | 0 refills | Status: AC | PRN

## 2021-10-08 NOTE — ED Triage Notes (Signed)
Left foot pain and swelling since last night, denies injury of any kind or ortho issues  except knee replacement that side can wiggle toes , top of foot is warm to touch and slightly swollen

## 2021-10-08 NOTE — Telephone Encounter (Signed)
See alternate results and phone note dated 9/11

## 2021-10-08 NOTE — ED Provider Notes (Signed)
[facility] EMERGENCY DEPARTMENT Provider Note   CSN: [id number] Arrival date & time: 10/08/21  0854     History  Chief Complaint  Patient presents with   Foot Pain    Renee [name] is a 70 y.o. female past medical history pertinent for gastric bypass, arthritis, total left knee arthroplasty, collagenous colitis managed with Entocort. Presents for progressive left foot pain and swelling for the last week. Noticed left foot pain one week ago, pain improved with rest and ice. Yesterday left foot pain worsened and patient was unable to walk without limping. Took aspirin yesterday with minimal improvement. Patient woke this morning with severe left foot pain and unable to place weight on foot. Denies any recent injury. Denies recent lifestyle changes that could attribute to pain. No history of DVT, gout. Patient now has pain at rest and is unable to move foot. Pain is localized to the top and bottom of left foot. No pain above ankle. Right foot is unremarkable. Has not taken anything for pain today. Denies fever, chills, generalized pain.    Foot Pain       Home Medications Prior to Admission medications   Medication Sig Start Date End Date Taking? Authorizing Provider  aspirin 81 MG chewable tablet Chew 1 tablet (81 mg total) by mouth 2 (two) times daily. 08/28/17   Kirtland Bouchard, PA-C  budesonide (ENTOCORT EC) 3 MG 24 hr capsule Take 3 capsules (9 mg total) by mouth daily for 60 days, THEN 2 capsules (6 mg total) daily for 14 days, THEN 1 capsule (3 mg total) daily for 14 days. 10/06/21 01/01/22  Doree Albee, PA-C  cholestyramine (QUESTRAN) 4 g packet Take 1 packet (4 g total) by mouth 2 (two) times daily. 07/03/21   Doree Albee, PA-C  citalopram (CELEXA) 40 MG tablet Take 40 mg by mouth daily.     [provider]  losartan-hydrochlorothiazide (HYZAAR) 50-12.5 MG tablet Take 1 tablet by mouth daily.    [provider]  Multiple Vitamins-Minerals (ADULT  GUMMY PO) Take 1 tablet by mouth daily.    [provider]  Potassium 99 MG TABS Take 99 mg by mouth daily.    [provider]  traZODone (DESYREL) 50 MG tablet TAKE 2 TABLETS BY MOUTH EVERY NIGHT AT BEDTIME 05/19/15   [provider]  vitamin B-12 (CYANOCOBALAMIN) 100 MCG tablet Take 100 mcg by mouth daily.    [provider]      Allergies    Psyllium    Review of Systems   Review of Systems  Physical Exam Updated Vital Signs Ht 5\' 2"  (1.575 m)   Wt 65.3 kg   BMI 26.34 kg/m  Physical Exam Vitals and nursing note reviewed.  Constitutional:      General: She is not in acute distress.    Appearance: She is well-developed. She is not diaphoretic.  HENT:     Head: Normocephalic and atraumatic.     Right Ear: External ear normal.     Left Ear: External ear normal.     Nose: Nose normal.     Mouth/Throat:     Mouth: Mucous membranes are moist.  Eyes:     General: No scleral icterus.    Conjunctiva/sclera: Conjunctivae normal.  Cardiovascular:     Rate and Rhythm: Normal rate and regular rhythm.     Heart sounds: Normal heart sounds. No murmur heard.    No friction rub. No gallop.  Pulmonary:  Effort: Pulmonary effort is normal. No respiratory distress.     Breath sounds: Normal breath sounds.  Abdominal:     General: Bowel sounds are normal. There is no distension.     Palpations: Abdomen is soft. There is no mass.     Tenderness: There is no abdominal tenderness. There is no guarding.  Musculoskeletal:     Cervical back: Normal range of motion.     Comments: Bilateral foot examination.  Inspection of the left foot reveals swelling over the dorsum and ball of the foot as well as the plantar surface.  The foot is warm to the touch compared with the right.  DP and PT pulses 2+ laterally.  Patient has severe pain with any passive or active range of motion at the ankle which is worse with dorsiflexion and plantarflexion.  Cap refill less than  2 seconds.  Normal sensation.  Skin:    General: Skin is warm and dry.  Neurological:     Mental Status: She is alert and oriented to person, place, and time.  Psychiatric:        Behavior: Behavior normal.     ED Results / Procedures / Treatments   Labs (all labs ordered are listed, but only abnormal results are displayed) Labs Reviewed - No data to display  EKG None  Radiology No results found.  Procedures Procedures    Medications Ordered in ED Medications - No data to display  ED Course/ Medical Decision Making/ A&P Clinical Course as of 10/08/21 1018  Mon Oct 08, 2021  0957 DG Foot Complete Left I  personally visualized and interpreted Plain film of the left foot which shows a stable, nondisplaced fracture of the base of the proximal left 5th phalanx. [AH]    Clinical Course User Index [AH] Arthor Captain, PA-C                           Medical Decision Making 71 year old female who presents emergency department with foot pain.  Differential diagnosis includes gout, osteomyelitis, subacute fracture, cellulitis, tendinitis.  Review of all data points CBC without elevated white blood cell count, BMP shows mild hypokalemia, uric acid within normal limits, since CRP returned just above normal.  Images including of venous ultrasound which I visualized and interpreted shows no evidence of acute DVT.  Left foot x-ray shows subacute chronic fracture deformity of the fifth proximal phalanx.  This appears old and has callus formation. I visualized and interpreted left foot x-ray.  With these findings I suspect patient symptoms are secondary to her fracture.  Patient placed in cam walker boot.  She has a walker and crutches at home.  She is to remain nonweightbearing until she follows up with podiatry or Ortho.  No acute findings otherwise.  She is neurovascularly intact without signs of compartment syndrome.  Patient appears otherwise appropriate for discharge at this  time  Amount and/or Complexity of Data Reviewed Labs: ordered. Radiology: ordered. Decision-making details documented in ED Course.  Risk Prescription drug management.           Final Clinical Impression(s) / ED Diagnoses Final diagnoses:  None    Rx / DC Orders ED Discharge Orders     None         Arthor Captain, PA-C 10/08/21 1618    Pricilla Loveless, MD 10/11/21 [phone]

## 2021-10-08 NOTE — Telephone Encounter (Signed)
Amanda sent in the prescription.  Letter mailed with appt information

## 2021-10-08 NOTE — ED Notes (Signed)
Discharge instructions reviewed with patient. Patient verbalizes understanding, no further questions at this time. Medications/prescriptions and follow up information provided. No acute distress noted at time of departure.  

## 2021-10-08 NOTE — Discharge Instructions (Signed)
Get help right away if you have: Any of the following in your toes or your foot: Numbness that gets worse. Tingling. Coldness. Blue skin. Redness or swelling that gets worse. Pain that suddenly becomes severe. 

## 2021-10-08 NOTE — ED Notes (Signed)
Pt states she does not want the crutches, has some at a friends house.

## 2021-10-10 ENCOUNTER — Ambulatory Visit: Payer: Medicare HMO | Admitting: Orthopaedic Surgery

## 2021-10-10 DIAGNOSIS — S92502A Displaced unspecified fracture of left lesser toe(s), initial encounter for closed fracture: Secondary | ICD-10-CM

## 2021-10-10 NOTE — Progress Notes (Signed)
Office Visit Note   Patient: Renee [name]           Date of Birth: January 17, 1951           MRN: [id number] Visit Date: 10/10/2021              Requested by: Burnis Medin, PA-C [id number] [place],  [place] [address] PCP: Piedad Climes, [place], [place]   Assessment & Plan: Visit Diagnoses:  1. Closed fracture of phalanx of left fifth toe, initial encounter     Plan: Impression is left foot fifth toe fracture.  This should be amenable to nonoperative treatment.  She is only been in a cam boot for about 2 to 3 days so we will have her continue ambulating in this for another 4 weeks.  She will follow-up with [place] at that time for repeat evaluation and three-view x-rays of the left foot.  Call with concerns or questions.  Follow-Up Instructions: Return in about 4 weeks (around 11/07/2021).   Orders:  No orders of the defined types were placed in this encounter.  No orders of the defined types were placed in this encounter.     Procedures: No procedures performed   Clinical Data: No additional findings.   Subjective: Chief Complaint  Patient presents with   Left Foot - Fracture    HPI patient is a pleasant 71 year old female who comes in today following an injury to her left foot.  About 1 month ago, she dropped a heavy box landing on the top of her foot.  She had slight soreness at that time but this resolved.  She noticed increased pain this past Monday and was seen in the ED.  X-rays were obtained which showed 1/5 toe fracture.  She was placed in a cam walker.  She has been doing much better since ambulating in the cam walker.  She is not taking anything for pain.   Review of Systems as detailed in HPI.  All others reviewed and are negative.   Objective: Vital Signs: There were no vitals taken for this visit.  Physical Exam well-developed well-nourished female no acute distress.  Alert and oriented x3.  Ortho Exam examination of the left foot reveals  no tenderness to the fracture site.  No skin changes.  She is neurovascular intact distally.  Specialty Comments:  No specialty comments available.  Imaging: No new imaging   PMFS History: Patient Active Problem List   Diagnosis Date Noted   Status post total left knee replacement 08/26/2017   Unilateral primary osteoarthritis, left knee 07/16/2017   Collagenous colitis 05/24/2016   Past Medical History:  Diagnosis Date   Anemia    Arthritis    Collagenous colitis    Depression    Edema     Family History  Problem Relation Age of Onset   Diabetes Mother    Heart disease Mother    Heart disease Father    Breast cancer Maternal Grandmother    Colon cancer Paternal Grandfather     Past Surgical History:  Procedure Laterality Date   CESAREAN SECTION     GASTRIC BYPASS     TOTAL KNEE ARTHROPLASTY Left 08/26/2017   Procedure: LEFT TOTAL KNEE ARTHROPLASTY;  Surgeon: Kathryne Hitch, MD;  Location: [facility] OR;  Service: Orthopedics;  Laterality: Left;   Social History   Occupational History   Occupation: LPN    Comment: retired   Occupation: [employer]  Tobacco Use   Smoking  status: Every Day    Packs/day: 0.50    Types: Cigarettes   Smokeless tobacco: Never   Tobacco comments:    social  Vaping Use   Vaping Use: Never used  Substance and Sexual Activity   Alcohol use: Yes   Drug use: No   Sexual activity: Not on file

## 2021-10-15 ENCOUNTER — Telehealth: Payer: Self-pay

## 2021-10-15 ENCOUNTER — Other Ambulatory Visit (HOSPITAL_COMMUNITY): Payer: Self-pay

## 2021-10-15 NOTE — Telephone Encounter (Signed)
Patient Advocate Encounter   Received notification from [facility] that prior authorization is required for Budesonide 3MG  dr capsules  Submitted: 10-15-2021 Key [id number]

## 2021-10-16 NOTE — Telephone Encounter (Signed)
Patient Advocate Encounter  Prior Authorization for Budesonide 3MG  dr capsules has been approved.     Effective: 10-16-2021 to 01-27-2022

## 2021-10-16 NOTE — Telephone Encounter (Signed)
Pt aware and states she already has the medication.

## 2021-10-30 ENCOUNTER — Ambulatory Visit: Payer: Medicare HMO | Admitting: Orthopaedic Surgery

## 2021-11-07 ENCOUNTER — Ambulatory Visit: Payer: Medicare HMO | Admitting: Physician Assistant
